# Patient Record
Sex: Female | Born: 1995 | Race: Black or African American | Hispanic: No | Marital: Single | Smoking: Never smoker
Health system: Southern US, Community
[De-identification: ages and names within clinical notes are randomized; demographics above are authoritative.]

---
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
## 2014-05-14 ENCOUNTER — Emergency Department (HOSPITAL_COMMUNITY)
Admission: EM | Admit: 2014-05-14 | Discharge: 2014-05-14 | Disposition: A | Discharge status: Home / Self Care | Payer: BC Managed Care – PPO | Source: Home / Self Care | Attending: Emergency Medicine | Admitting: Emergency Medicine

## 2014-05-14 ENCOUNTER — Encounter (HOSPITAL_COMMUNITY): Payer: Self-pay | Admitting: Emergency Medicine

## 2014-05-14 DIAGNOSIS — N390 Urinary tract infection, site not specified: Secondary | ICD-10-CM

## 2014-05-14 LAB — POCT URINALYSIS DIP (DEVICE)
Bilirubin Urine: NEGATIVE
GLUCOSE, UA: NEGATIVE mg/dL
Ketones, ur: NEGATIVE mg/dL
NITRITE: NEGATIVE
Protein, ur: 100 mg/dL — AB
Specific Gravity, Urine: >=1.03 (ref 1.005–1.030)
UROBILINOGEN UA: 0.2 mg/dL (ref 0.0–1.0)
pH: 6 (ref 5.0–8.0)

## 2014-05-14 LAB — POCT PREGNANCY, URINE: PREG TEST UR: NEGATIVE

## 2014-05-14 MED ORDER — CEFUROXIME AXETIL 250 MG PO TABS
250.0000 mg | ORAL_TABLET | Freq: Two times a day (BID) | ORAL | Status: DC
Start: 2014-05-14 — End: 2016-09-27

## 2014-05-14 NOTE — ED Provider Notes (Signed)
CSN: [id number]     Arrival date & time 05/14/14  1333 History   First MD Initiated Contact with Patient 05/14/14 1416     Chief Complaint  Patient presents with  . Abdominal Pain   (Consider location/radiation/quality/duration/timing/severity/associated sxs/prior Treatment) HPI      18 year old female presents for evaluation of lower abdominal pain, urinary frequency, and urinary urgency. This began last night. She has also noted a small amount of blood in her urine. She has had a UTI in the past that seemed very similar to this. Denies vaginal discharge. No risk for STDs. No nausea, vomiting, fever, chills, flank pain.  History reviewed. No pertinent past medical history. History reviewed. No pertinent past surgical history. No family history on file. History  Substance Use Topics  . Smoking status: Never Smoker   . Smokeless tobacco: Not on file  . Alcohol Use: No   OB History    No data available     Review of Systems  Gastrointestinal: Positive for abdominal pain. Negative for nausea, vomiting and diarrhea.  Genitourinary: Positive for urgency, frequency and hematuria. Negative for dysuria, flank pain and vaginal discharge.  All other systems reviewed and are negative.   Allergies  Review of patient's allergies indicates no known allergies.  Home Medications   Prior to Admission medications   Medication Sig Start Date End Date Taking? Authorizing Provider  cefUROXime (CEFTIN) 250 MG tablet Take 1 tablet (250 mg total) by mouth 2 (two) times daily with a meal. 05/14/14   Liam Graham, PA-C  dicyclomine (BENTYL) 20 MG tablet Take 1 tablet (20 mg total) by mouth 2 (two) times daily. 04/23/13   Kaitlyn Szekalski, PA-C  magnesium citrate SOLN Take 0.5 Bottles by mouth once.    Historical Provider, MD  ondansetron (ZOFRAN ODT) 4 MG disintegrating tablet Take 1 tablet (4 mg total) by mouth every 8 (eight) hours as needed for nausea. 04/23/13   Kaitlyn Szekalski, PA-C   polyethylene glycol powder (GLYCOLAX/MIRALAX) powder Take 17 g by mouth 2 (two) times daily. Until daily soft stools  OTC 04/23/13   Kaitlyn Szekalski, PA-C   BP 111/69 mmHg  Pulse 70  Temp(Src) 98.1 F (36.7 C) (Oral)  Resp 18  SpO2 99%  LMP 05/04/2014 Physical Exam  Constitutional: She is oriented to person, place, and time. Vital signs are normal. She appears well-developed and well-nourished. No distress.  HENT:  Head: Normocephalic and atraumatic.  Pulmonary/Chest: Effort normal. No respiratory distress.  Abdominal: Soft. She exhibits no mass. There is tenderness (minimal suprapubic tenderness). There is no rebound and no guarding.  No CVA tenderness  Neurological: She is alert and oriented to person, place, and time. She has normal strength. Coordination normal.  Skin: Skin is warm and dry. No rash noted. She is not diaphoretic.  Psychiatric: She has a normal mood and affect. Judgment normal.  Nursing note and vitals reviewed.   ED Course  Procedures (including critical care time) Labs Review Labs Reviewed  POCT URINALYSIS DIP (DEVICE) - Abnormal; Notable for the following:    Hgb urine dipstick LARGE (*)    Protein, ur 100 (*)    Leukocytes, UA SMALL (*)    All other components within normal limits  URINE CULTURE  POCT PREGNANCY, URINE    Imaging Review No results found.   MDM   1. UTI (lower urinary tract infection)    Simple UTI. Urine culture sent. Treat with Ceftin. Follow-up when necessary  Meds ordered this encounter  Medications  .  cefUROXime (CEFTIN) 250 MG tablet    Sig: Take 1 tablet (250 mg total) by mouth 2 (two) times daily with a meal.    Dispense:  10 tablet    Refill:  0    Order Specific Question:  Supervising Provider    Answer:  Carmela Hurt     Liam Graham, PA-C 05/14/14 1440

## 2014-05-14 NOTE — ED Notes (Signed)
C/o poss UTI onset this am Sx include abd pain and freq urination Denies fevers, chills, back pain, n/v Alert, no signs of acute distress.

## 2014-05-14 NOTE — Discharge Instructions (Signed)

## 2014-05-15 LAB — URINE CULTURE
Colony Count: NO GROWTH
Culture: NO GROWTH

## 2014-08-09 IMAGING — US US ABDOMEN COMPLETE
1 series · 13 of 25 positions shown · non-contrast
Comparison: No priors.

CLINICAL DATA: Abdominal pain.  Menorrhagia.

COMPLETE ABDOMINAL ULTRASOUND

[Series 1: us abdomen complete · 13 of 85 slices shown]
[im 1/85]
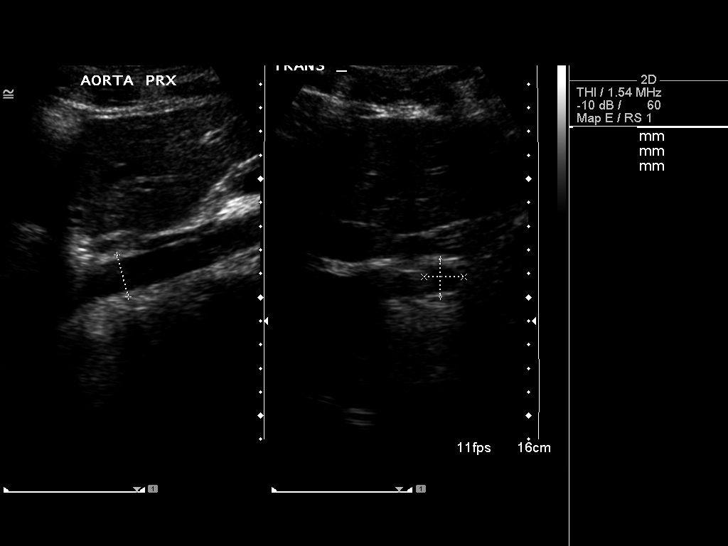
[im 8/85]
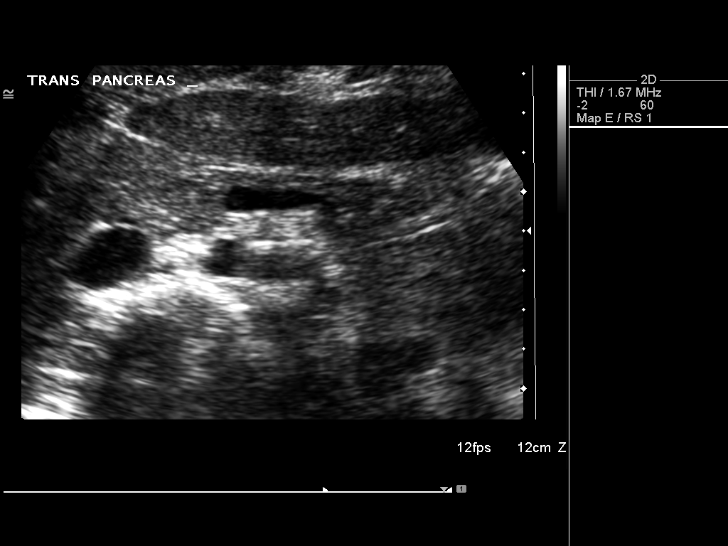
[im 15/85]
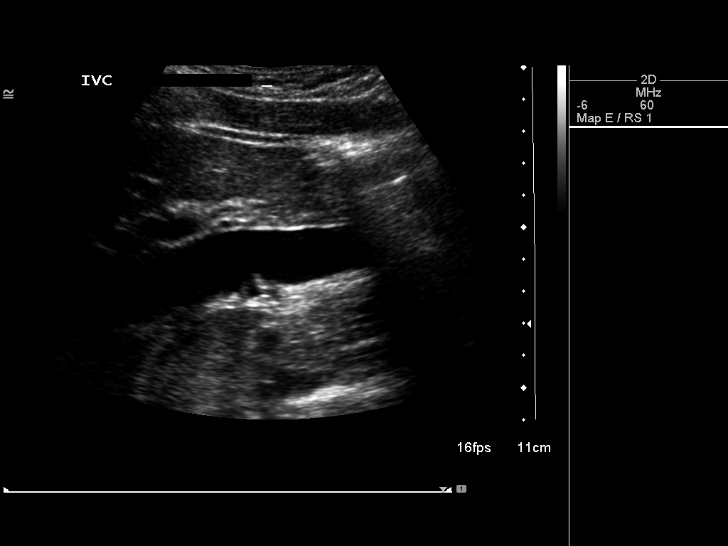
[im 22/85]
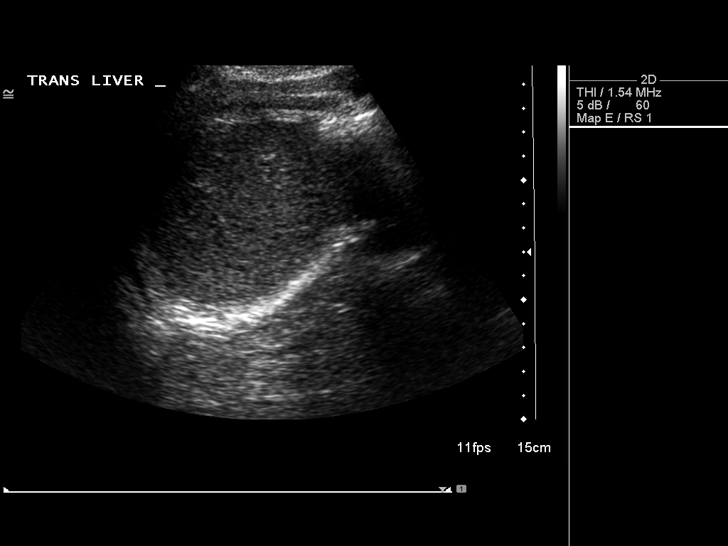
[im 29/85]
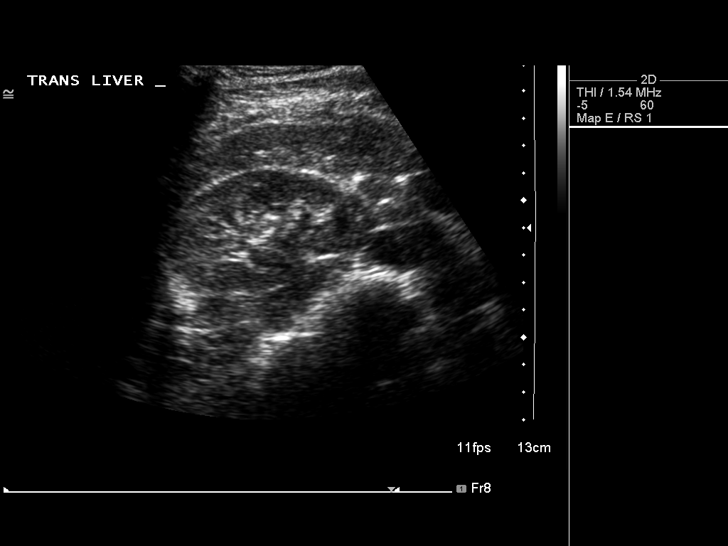
[im 36/85]
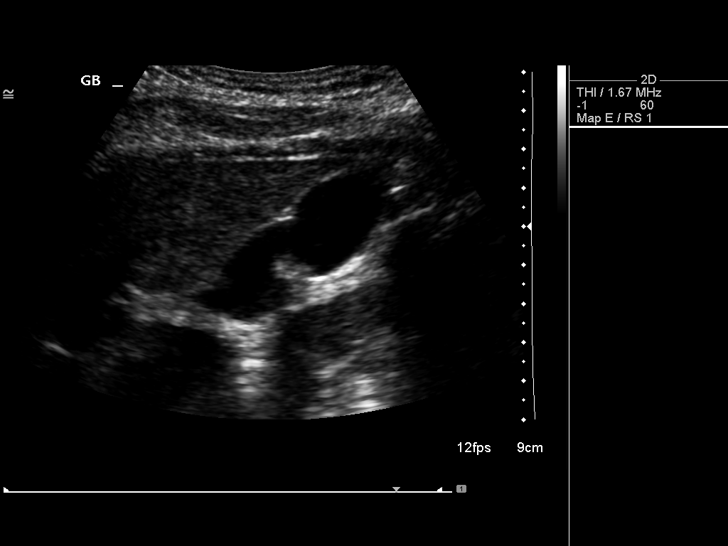
[im 43/85]
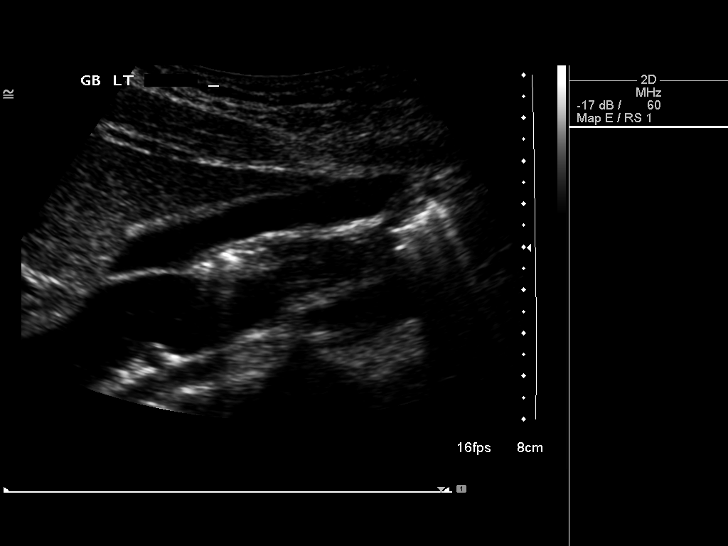
[im 50/85]
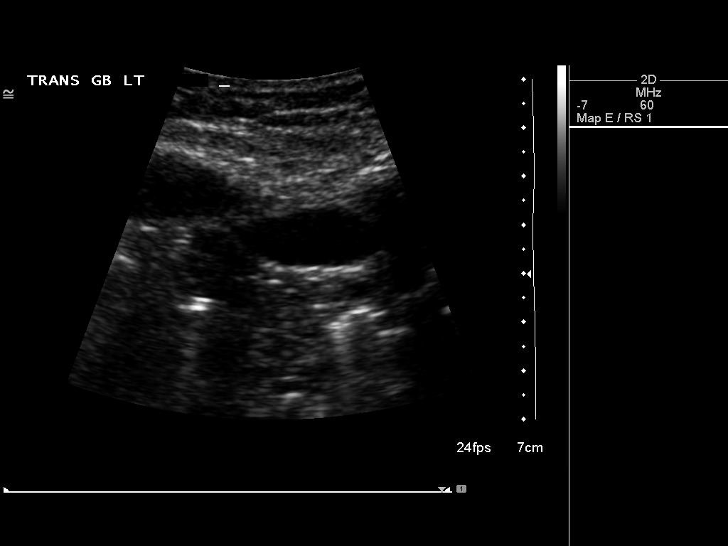
[im 57/85]
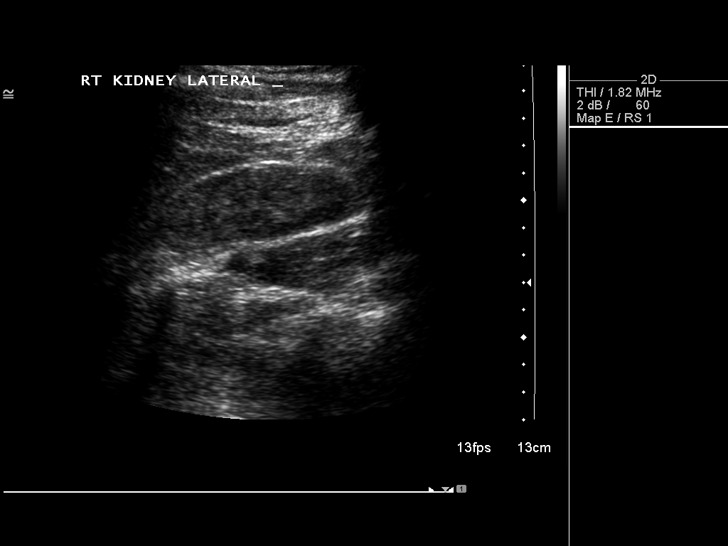
[im 64/85]
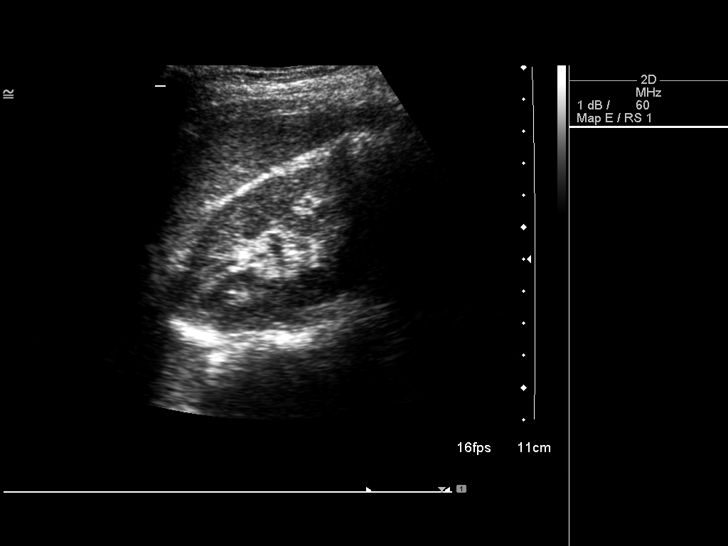
[im 71/85]
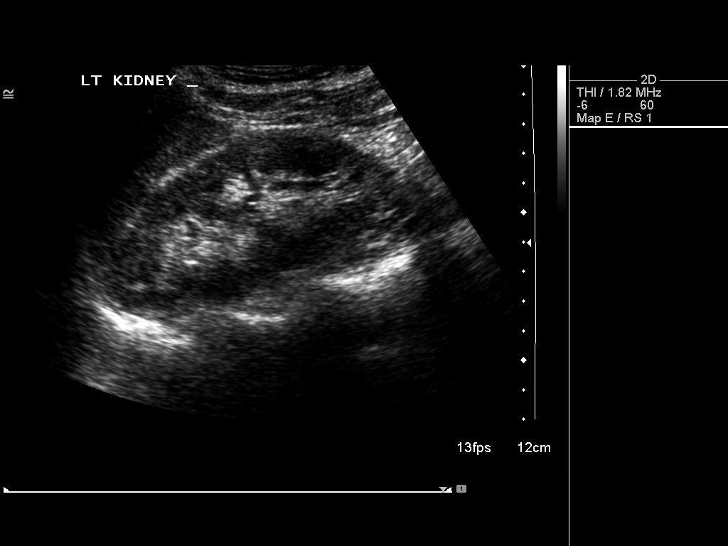
[im 78/85]
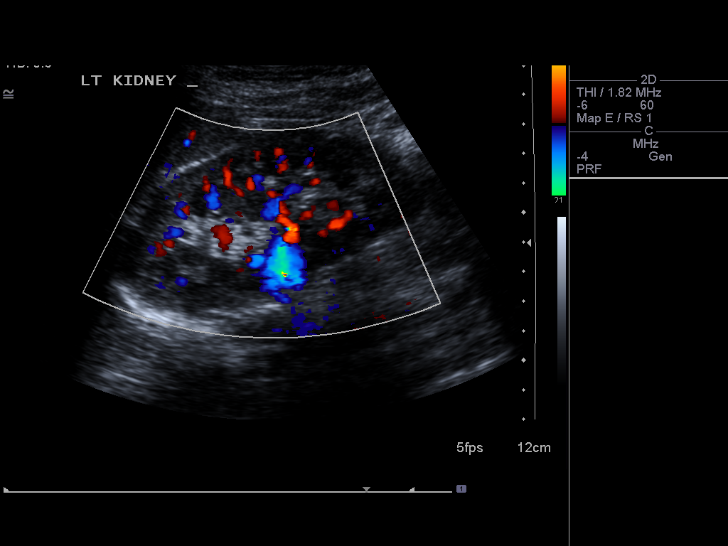
[im 85/85]
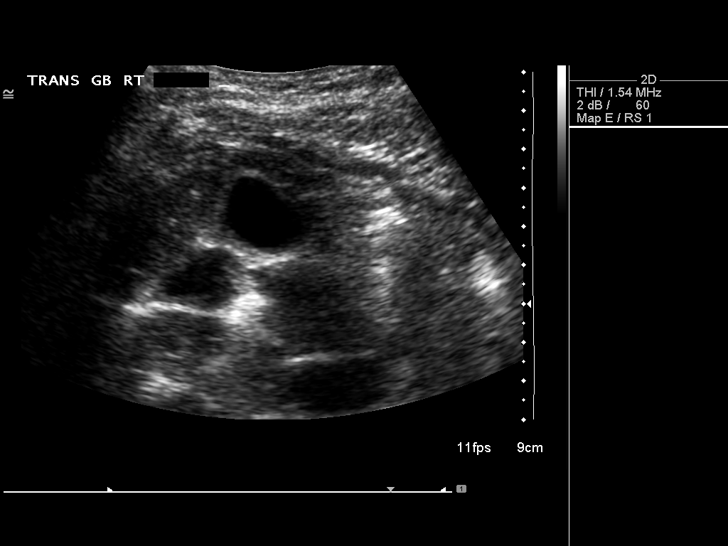

[13 of 25 positions shown; findings below may reference images not displayed]

FINDINGS: Gallbladder:  No shadowing gallstones or echogenic sludge.  No
gallbladder wall thickening or pericholecystic fluid.  Negative
sonographic Murphy's sign according to the ultrasound technologist.

Common bile duct:  Measures 2.1 mm in the porta hepatis.

Liver:  Normal size and echotexture without focal parenchymal
abnormality.  Patent portal vein with hepatopetal flow.

IVC:  Patent throughout its visualized course in the abdomen.

Pancreas:  Although the pancreas is difficult to visualize in its
entirety, no focal pancreatic abnormality is identified.

Spleen:  Normal size and echotexture without focal parenchymal
abnormality.7.5 cm in length.

Right Kidney:  No hydronephrosis.  Well-preserved cortex.  Normal
size and parenchymal echotexture without focal abnormalities.
cm in length.

Left Kidney:  No hydronephrosis.  Well-preserved cortex.  Normal
size and parenchymal echotexture without focal abnormalities.
cm in length.

Abdominal aorta:  Measures up to measures up to 1.8 cm in diameter
proximally, and tapers appropriately distally.
IMPRESSION: 1.  Unremarkable abdominal ultrasound.  No findings to account for
the patient's symptoms.

## 2016-09-26 ENCOUNTER — Ambulatory Visit: Payer: Self-pay | Admitting: Nurse Practitioner

## 2016-09-27 ENCOUNTER — Encounter (HOSPITAL_COMMUNITY): Payer: Self-pay

## 2016-09-27 ENCOUNTER — Emergency Department (HOSPITAL_COMMUNITY)
Admission: EM | Admit: 2016-09-27 | Discharge: 2016-09-27 | Disposition: A | Discharge status: Home / Self Care | Payer: BLUE CROSS/BLUE SHIELD | Attending: Emergency Medicine | Admitting: Emergency Medicine

## 2016-09-27 DIAGNOSIS — R197 Diarrhea, unspecified: Secondary | ICD-10-CM | POA: Insufficient documentation

## 2016-09-27 DIAGNOSIS — R112 Nausea with vomiting, unspecified: Secondary | ICD-10-CM

## 2016-09-27 DIAGNOSIS — M549 Dorsalgia, unspecified: Secondary | ICD-10-CM

## 2016-09-27 DIAGNOSIS — M545 Low back pain: Secondary | ICD-10-CM | POA: Insufficient documentation

## 2016-09-27 LAB — COMPREHENSIVE METABOLIC PANEL
ALK PHOS: 54 U/L (ref 38–126)
ALT: 28 U/L (ref 14–54)
ANION GAP: 10 (ref 5–15)
AST: 29 U/L (ref 15–41)
Albumin: 4.9 g/dL (ref 3.5–5.0)
BUN: 14 mg/dL (ref 6–20)
CO2: 22 mmol/L (ref 22–32)
Calcium: 9.8 mg/dL (ref 8.9–10.3)
Chloride: 105 mmol/L (ref 101–111)
Creatinine, Ser: 0.9 mg/dL (ref 0.44–1.00)
GFR calc non Af Amer: >60 mL/min (ref >60)
Glucose, Bld: 97 mg/dL (ref 65–99)
POTASSIUM: 3.5 mmol/L (ref 3.5–5.1)
SODIUM: 137 mmol/L (ref 135–145)
Total Bilirubin: 1.5 mg/dL — ABNORMAL HIGH (ref 0.3–1.2)
Total Protein: 8.5 g/dL — ABNORMAL HIGH (ref 6.5–8.1)

## 2016-09-27 LAB — CBC
HCT: 41.2 % (ref 36.0–46.0)
HEMOGLOBIN: 14.5 g/dL (ref 12.0–15.0)
MCH: 30.7 pg (ref 26.0–34.0)
MCHC: 35.2 g/dL (ref 30.0–36.0)
MCV: 87.1 fL (ref 78.0–100.0)
PLATELETS: 262 10*3/uL (ref 150–400)
RBC: 4.73 MIL/uL (ref 3.87–5.11)
RDW: 14 % (ref 11.5–15.5)
WBC: 8.3 10*3/uL (ref 4.0–10.5)

## 2016-09-27 LAB — URINALYSIS, ROUTINE W REFLEX MICROSCOPIC
BILIRUBIN URINE: NEGATIVE
Bacteria, UA: NONE SEEN
GLUCOSE, UA: NEGATIVE mg/dL
HGB URINE DIPSTICK: NEGATIVE
Ketones, ur: 80 mg/dL — AB
NITRITE: NEGATIVE
PH: 8 (ref 5.0–8.0)
Protein, ur: NEGATIVE mg/dL
SPECIFIC GRAVITY, URINE: 1.026 (ref 1.005–1.030)

## 2016-09-27 LAB — LIPASE, BLOOD: LIPASE: 18 U/L (ref 11–51)

## 2016-09-27 LAB — PREGNANCY, URINE: Preg Test, Ur: NEGATIVE

## 2016-09-27 MED ORDER — KETOROLAC TROMETHAMINE 60 MG/2ML IM SOLN
60.0000 mg | Freq: Once | INTRAMUSCULAR | Status: AC
  Administered 2016-09-27: 60 mg via INTRAMUSCULAR
  Filled 2016-09-27: qty 2

## 2016-09-27 MED ORDER — ONDANSETRON 4 MG PO TBDP: 4.0000 mg | ORAL_TABLET | Freq: Three times a day (TID) | ORAL | 0 refills | Status: DC | PRN

## 2016-09-27 MED ORDER — IBUPROFEN 800 MG PO TABS
800.0000 mg | ORAL_TABLET | Freq: Once | ORAL | Status: AC
  Administered 2016-09-27: 800 mg via ORAL
  Filled 2016-09-27: qty 1

## 2016-09-27 MED ORDER — METHOCARBAMOL 500 MG PO TABS: 500.0000 mg | ORAL_TABLET | Freq: Two times a day (BID) | ORAL | 0 refills | Status: DC

## 2016-09-27 MED ORDER — METHOCARBAMOL 500 MG PO TABS
500.0000 mg | ORAL_TABLET | Freq: Once | ORAL | Status: AC
  Administered 2016-09-27: 500 mg via ORAL
  Filled 2016-09-27: qty 1

## 2016-09-27 NOTE — ED Notes (Signed)
PT GIVEN 240ML OF WATER FOR PO CHALLENGE.

## 2016-09-27 NOTE — ED Notes (Signed)
Patient refused zofran when offered. Patient stated, "I really don't have nausea right now.

## 2016-09-27 NOTE — ED Triage Notes (Signed)
Patient c/o n/V and diarrhea x 1 since this AM.

## 2016-09-27 NOTE — ED Provider Notes (Signed)
Recheck after medications from back pain. Motrin, Robaxin, Toradol.  Recheck finds the patient comfortable appearing, talking with mom. She is feeling better and is stable for discharge.    Charlann Lange, PA-C 09/28/16 [place], MD 10/02/16 [phone]

## 2016-09-27 NOTE — Discharge Instructions (Signed)
Take the prescribed medication as directed.  Can continue your 800mg  motrin at home with these medications.  Can also continue warm compresses to the back with heating pad. Follow-up with your primary care doctor. Return to the ED for new or worsening symptoms.

## 2016-09-27 NOTE — ED Provider Notes (Signed)
[facility] DEPT Provider Note   CSN: [id number] Arrival date & time: 09/27/16  1529     History   Chief Complaint Chief Complaint  Patient presents with  . Emesis  . Back Pain    HPI Ana Bean is a 21 y.o. female.  The history is provided by the patient and medical records.  Emesis   Associated symptoms include diarrhea.  Back Pain      21 year old female here with nausea, vomiting, and diarrhea which began around 7 AM upon waking this morning. She reports multiple episodes this morning, but improved by 2 PM this afternoon. States those symptoms seem to have improved, now she just has ongoing back pain. She thinks this is from where she was leaned over the toilet all morning. She has not had any falls or trauma. Reports pain is dull and aching with some "spasms". Pain has improved with application of heat pack here in the ED. She denies any fever or chills. No urinary symptoms. She does have history of UTIs in the past. Last menstrual period was 2 weeks ago and was normal.  Has not tried any medications for her symptoms today.  History reviewed. No pertinent past medical history.  There are no active problems to display for this patient.   History reviewed. No pertinent surgical history.  OB History    No data available       Home Medications    Prior to Admission medications   Not on File    Family History No family history on file.  Social History Social History  Substance Use Topics  . Smoking status: Never Smoker  . Smokeless tobacco: Never Used  . Alcohol use No     Allergies   Patient has no known allergies.   Review of Systems Review of Systems  Gastrointestinal: Positive for diarrhea, nausea and vomiting.  Musculoskeletal: Positive for back pain.  All other systems reviewed and are negative.    Physical Exam Updated Vital Signs BP 101/68 (BP Location: Right Arm)   Pulse 86   Temp 98.1 F (36.7 C) (Oral)   Resp 12   Ht 5\' 3"   (1.6 m)   Wt 52.2 kg   LMP 09/08/2016 (Approximate)   SpO2 100%   BMI 20.37 kg/m   Physical Exam  Constitutional: She is oriented to person, place, and time. She appears well-developed and well-nourished.  HENT:  Head: Normocephalic and atraumatic.  Mouth/Throat: Oropharynx is clear and moist.  Eyes: Conjunctivae and EOM are normal. Pupils are equal, round, and reactive to light.  Neck: Normal range of motion.  Cardiovascular: Normal rate, regular rhythm and normal heart sounds.   Pulmonary/Chest: Effort normal and breath sounds normal. No respiratory distress. She has no wheezes.  Abdominal: Soft. Bowel sounds are normal. There is no tenderness. There is no rebound.  No CVA tenderness  Musculoskeletal: Normal range of motion.  Muscular tenderness of the lumbar region which is mild, there is no midline shift or deformity, full range of motion maintained, normal strength/sensation of both legs, normal gait  Neurological: She is alert and oriented to person, place, and time.  Skin: Skin is warm and dry.  Psychiatric: She has a normal mood and affect.  Nursing note and vitals reviewed.    ED Treatments / Results  Labs (all labs ordered are listed, but only abnormal results are displayed) Labs Reviewed  COMPREHENSIVE METABOLIC PANEL - Abnormal; Notable for the following:       Result Value  Total Protein 8.5 (*)    Total Bilirubin 1.5 (*)    All other components within normal limits  URINALYSIS, ROUTINE W REFLEX MICROSCOPIC - Abnormal; Notable for the following:    APPearance HAZY (*)    Ketones, ur 80 (*)    Leukocytes, UA TRACE (*)    Squamous Epithelial / LPF 0-5 (*)    All other components within normal limits  LIPASE, BLOOD  CBC  PREGNANCY, URINE    EKG  EKG Interpretation None       Radiology No results found.  Procedures Procedures (including critical care time)  Medications Ordered in ED Medications - No data to display   Initial Impression /  Assessment and Plan / ED Course  I have reviewed the triage vital signs and the nursing notes.  Pertinent labs & imaging results that were available during my care of the patient were reviewed by me and considered in my medical decision making (see chart for details).  21 year old female here with nausea, vomiting, and diarrhea which begins waking. He now reports some back pain. She is afebrile and nontoxic. Abdomen is soft and benign.  She has some muscular tenderness of her lumbar paraspinal regions bilaterally without noted midline step-off or deformity.  No neurologic deficits, no red flag symptoms.  May have pain from being bent over vomiting all morning.  Screening lab work overall reassuring.  UA here without signs of infection or blood to suggest acute renal pathology.  Suspect muscular etiology of her back pain, likely viral etiology of her N/V/D.  Will plan for symptomatic care here, d/c home with same.  Currently tolerating PO well without issue.  7:52 PM Care signed out to oncoming provider.  Anticipate discharge once feeling better.  Final Clinical Impressions(s) / ED Diagnoses   Final diagnoses:  Nausea vomiting and diarrhea  Back pain, unspecified back location, unspecified back pain laterality, unspecified chronicity    New Prescriptions New Prescriptions   METHOCARBAMOL (ROBAXIN) 500 MG TABLET    Take 1 tablet (500 mg total) by mouth 2 (two) times daily.   ONDANSETRON (ZOFRAN ODT) 4 MG DISINTEGRATING TABLET    Take 1 tablet (4 mg total) by mouth every 8 (eight) hours as needed for nausea.     Larene Pickett, PA-C 09/27/16 1953    Duffy Bruce, MD 09/29/16 [phone]

## 2016-10-07 ENCOUNTER — Encounter: Payer: Self-pay | Admitting: Nurse Practitioner

## 2016-10-07 ENCOUNTER — Ambulatory Visit (INDEPENDENT_AMBULATORY_CARE_PROVIDER_SITE_OTHER)
Admission: RE | Admit: 2016-10-07 | Discharge: 2016-10-07 | Disposition: A | Discharge status: Home / Self Care | Payer: BLUE CROSS/BLUE SHIELD | Source: Ambulatory Visit | Attending: Nurse Practitioner | Admitting: Nurse Practitioner

## 2016-10-07 ENCOUNTER — Ambulatory Visit: Payer: Self-pay | Admitting: Nurse Practitioner

## 2016-10-07 ENCOUNTER — Ambulatory Visit (INDEPENDENT_AMBULATORY_CARE_PROVIDER_SITE_OTHER): Payer: BLUE CROSS/BLUE SHIELD | Admitting: Nurse Practitioner

## 2016-10-07 ENCOUNTER — Other Ambulatory Visit (INDEPENDENT_AMBULATORY_CARE_PROVIDER_SITE_OTHER): Payer: BLUE CROSS/BLUE SHIELD

## 2016-10-07 VITALS — BP 106/72 | HR 58 | Temp 97.6°F | Ht 63.0 in | Wt 110.0 lb

## 2016-10-07 DIAGNOSIS — R634 Abnormal weight loss: Secondary | ICD-10-CM

## 2016-10-07 DIAGNOSIS — R61 Generalized hyperhidrosis: Secondary | ICD-10-CM

## 2016-10-07 DIAGNOSIS — F419 Anxiety disorder, unspecified: Secondary | ICD-10-CM | POA: Insufficient documentation

## 2016-10-07 LAB — T4, FREE: FREE T4: 1.03 ng/dL (ref 0.60–1.60)

## 2016-10-07 LAB — TSH: TSH: 1.57 u[IU]/mL (ref 0.35–4.50)

## 2016-10-07 LAB — HEMOGLOBIN A1C: HEMOGLOBIN A1C: 5.5 % (ref 4.6–6.5)

## 2016-10-07 NOTE — Patient Instructions (Addendum)
Sign medical release to get records from GYN.  Urine pregnancy, CBC, CMP, lipase, and urinalysis done 1week ago.

## 2016-10-07 NOTE — Progress Notes (Signed)
Pre visit review using our clinic review tool, if applicable. No additional management support is needed unless otherwise documented below in the visit note. 

## 2016-10-07 NOTE — Progress Notes (Signed)
Subjective:  Patient ID: Ana Bean, female    DOB: 09/04/1995  Age: 21 y.o. MRN: [id number]  CC: Establish Care (est care/breast exam ([facility])--lossing weight--20 lb within 1 month. tdap?)   HPI   GYN: Dr.Haygood, up to date with PAP  Weight loss: [place] more than 20Ibs in last 25months Normal appetite. Also develops night sweats in last few weeks. Hx of anxiety, no medication. No palpitation. Denies any stress or anxiety or depression at this time. Denies any recent travel out of country. No nausea, no ABD pain, no diarrhea. No Gu symptoms Sexually active, use condom, no birth control. LMP 10/06/2016.  Outpatient Medications Prior to Visit  Medication Sig Dispense Refill  . methocarbamol (ROBAXIN) 500 MG tablet Take 1 tablet (500 mg total) by mouth 2 (two) times daily. (Patient not taking: Reported on 10/07/2016) 20 tablet 0  . ondansetron (ZOFRAN ODT) 4 MG disintegrating tablet Take 1 tablet (4 mg total) by mouth every 8 (eight) hours as needed for nausea. (Patient not taking: Reported on 10/07/2016) 10 tablet 0   No facility-administered medications prior to visit.     ROS See HPI  Objective:  BP 106/72   Pulse (!) 58   Temp 97.6 F (36.4 C)   Ht 5\' 3"  (1.6 m)   Wt 110 lb (49.9 kg)   LMP 10/06/2016 (Exact Date)   SpO2 98%   BMI 19.49 kg/m   BP Readings from Last 3 Encounters:  10/07/16 106/72  09/27/16 (!) 93/58  05/14/14 111/69    Wt Readings from Last 3 Encounters:  10/07/16 110 lb (49.9 kg)  09/27/16 115 lb (52.2 kg)  04/23/13 117 lb (53.1 kg) (34 %, Z= -0.41)*   * Growth percentiles are based on CDC 2-20 Years data.    Physical Exam  Constitutional: She is oriented to person, place, and time.  Neck: Normal range of motion. Neck supple.  Cardiovascular: Normal rate, regular rhythm and normal heart sounds.   Pulmonary/Chest: Effort normal and breath sounds normal.  Abdominal: Soft. Bowel sounds are normal.  Lymphadenopathy:    She has no cervical  adenopathy.  Neurological: She is alert and oriented to person, place, and time.  Skin: Skin is warm and dry.  Vitals reviewed.   Lab Results  Component Value Date   WBC 8.3 09/27/2016   HGB 14.5 09/27/2016   HCT 41.2 09/27/2016   PLT 262 09/27/2016   GLUCOSE 97 09/27/2016   ALT 28 09/27/2016   AST 29 09/27/2016   NA 137 09/27/2016   K 3.5 09/27/2016   CL 105 09/27/2016   CREATININE 0.90 09/27/2016   BUN 14 09/27/2016   CO2 22 09/27/2016    No results found.  Assessment & Plan:   Ana Bean was seen today for establish care.  Diagnoses and all orders for this visit:  Weight loss, unintentional -     TSH; Future -     T4, free; Future -     DG Chest 2 View; Future -     Hemoglobin A1c; Future  Night sweats -     TSH; Future -     T4, free; Future -     DG Chest 2 View; Future -     Hemoglobin A1c; Future   I have discontinued Ana Bean's ondansetron and methocarbamol.  No orders of the defined types were placed in this encounter.   Follow-up: Return in about 1 month (around 11/06/2016) for CPE and weight loss (fasting).  Wilfred Lacy,  NP

## 2016-10-07 NOTE — Progress Notes (Signed)
Normal results

## 2016-10-17 ENCOUNTER — Telehealth: Payer: Self-pay | Admitting: Nurse Practitioner

## 2016-10-17 NOTE — Telephone Encounter (Signed)
ROI fax to [facility]

## 2016-11-07 ENCOUNTER — Encounter: Payer: Self-pay | Admitting: Nurse Practitioner

## 2016-11-07 ENCOUNTER — Ambulatory Visit (INDEPENDENT_AMBULATORY_CARE_PROVIDER_SITE_OTHER): Payer: BLUE CROSS/BLUE SHIELD | Admitting: Nurse Practitioner

## 2016-11-07 ENCOUNTER — Other Ambulatory Visit (INDEPENDENT_AMBULATORY_CARE_PROVIDER_SITE_OTHER): Payer: BLUE CROSS/BLUE SHIELD

## 2016-11-07 ENCOUNTER — Other Ambulatory Visit (HOSPITAL_COMMUNITY)
Admission: RE | Admit: 2016-11-07 | Discharge: 2016-11-07 | Disposition: A | Discharge status: Home / Self Care | Payer: BLUE CROSS/BLUE SHIELD | Source: Ambulatory Visit | Attending: Nurse Practitioner | Admitting: Nurse Practitioner

## 2016-11-07 VITALS — BP 110/80 | HR 66 | Temp 97.6°F | Ht 63.0 in | Wt 113.0 lb

## 2016-11-07 DIAGNOSIS — Z0000 Encounter for general adult medical examination without abnormal findings: Secondary | ICD-10-CM

## 2016-11-07 DIAGNOSIS — Z13220 Encounter for screening for lipoid disorders: Secondary | ICD-10-CM | POA: Diagnosis not present

## 2016-11-07 DIAGNOSIS — A5901 Trichomonal vulvovaginitis: Secondary | ICD-10-CM | POA: Insufficient documentation

## 2016-11-07 DIAGNOSIS — Z136 Encounter for screening for cardiovascular disorders: Secondary | ICD-10-CM

## 2016-11-07 DIAGNOSIS — Z23 Encounter for immunization: Secondary | ICD-10-CM | POA: Diagnosis not present

## 2016-11-07 DIAGNOSIS — N6315 Unspecified lump in the right breast, overlapping quadrants: Secondary | ICD-10-CM | POA: Insufficient documentation

## 2016-11-07 DIAGNOSIS — Z124 Encounter for screening for malignant neoplasm of cervix: Secondary | ICD-10-CM | POA: Insufficient documentation

## 2016-11-07 DIAGNOSIS — Z0001 Encounter for general adult medical examination with abnormal findings: Secondary | ICD-10-CM

## 2016-11-07 DIAGNOSIS — Z114 Encounter for screening for human immunodeficiency virus [HIV]: Secondary | ICD-10-CM

## 2016-11-07 DIAGNOSIS — N6310 Unspecified lump in the right breast, unspecified quadrant: Secondary | ICD-10-CM

## 2016-11-07 LAB — LIPID PANEL
CHOL/HDL RATIO: 2
Cholesterol: 173 mg/dL (ref 0–200)
HDL: 82.9 mg/dL (ref >39.00)
LDL CALC: 79 mg/dL (ref 0–99)
NONHDL: 90.51
TRIGLYCERIDES: 58 mg/dL (ref 0.0–149.0)
VLDL: 11.6 mg/dL (ref 0.0–40.0)

## 2016-11-07 LAB — HIV ANTIBODY (ROUTINE TESTING W REFLEX): HIV 1&2 Ab, 4th Generation: NONREACTIVE

## 2016-11-07 NOTE — Patient Instructions (Signed)
Preventive Care 18-39 Years, Female Preventive care refers to lifestyle choices and visits with your health care provider that can promote health and wellness. What does preventive care include?  A yearly physical exam. This is also called an annual well check.  Dental exams once or twice a year.  Routine eye exams. Ask your health care provider how often you should have your eyes checked.  Personal lifestyle choices, including:  Daily care of your teeth and gums.  Regular physical activity.  Eating a healthy diet.  Avoiding tobacco and drug use.  Limiting alcohol use.  Practicing safe sex.  Taking vitamin and mineral supplements as recommended by your health care provider. What happens during an annual well check? The services and screenings done by your health care provider during your annual well check will depend on your age, overall health, lifestyle risk factors, and family history of disease. Counseling  Your health care provider may ask you questions about your:  Alcohol use.  Tobacco use.  Drug use.  Emotional well-being.  Home and relationship well-being.  Sexual activity.  Eating habits.  Work and work environment.  Method of birth control.  Menstrual cycle.  Pregnancy history. Screening  You may have the following tests or measurements:  Height, weight, and BMI.  Diabetes screening. This is done by checking your blood sugar (glucose) after you have not eaten for a while (fasting).  Blood pressure.  Lipid and cholesterol levels. These may be checked every 5 years starting at age 20.  Skin check.  Hepatitis C blood test.  Hepatitis B blood test.  Sexually transmitted disease (STD) testing.  BRCA-related cancer screening. This may be done if you have a family history of breast, ovarian, tubal, or peritoneal cancers.  Pelvic exam and Pap test. This may be done every 3 years starting at age 21. Starting at age 30, this may be done every 5  years if you have a Pap test in combination with an HPV test. Discuss your test results, treatment options, and if necessary, the need for more tests with your health care provider. Vaccines  Your health care provider may recommend certain vaccines, such as:  Influenza vaccine. This is recommended every year.  Tetanus, diphtheria, and acellular pertussis (Tdap, Td) vaccine. You may need a Td booster every 10 years.  Varicella vaccine. You may need this if you have not been vaccinated.  HPV vaccine. If you are 26 or younger, you may need three doses over 6 months.  Measles, mumps, and rubella (MMR) vaccine. You may need at least one dose of MMR. You may also need a second dose.  Pneumococcal 13-valent conjugate (PCV13) vaccine. You may need this if you have certain conditions and were not previously vaccinated.  Pneumococcal polysaccharide (PPSV23) vaccine. You may need one or two doses if you smoke cigarettes or if you have certain conditions.  Meningococcal vaccine. One dose is recommended if you are age 19-21 years and a first-year college student living in a residence hall, or if you have one of several medical conditions. You may also need additional booster doses.  Hepatitis A vaccine. You may need this if you have certain conditions or if you travel or work in places where you may be exposed to hepatitis A.  Hepatitis B vaccine. You may need this if you have certain conditions or if you travel or work in places where you may be exposed to hepatitis B.  Haemophilus influenzae type b (Hib) vaccine. You may need this   if you have certain risk factors. Talk to your health care provider about which screenings and vaccines you need and how often you need them. This information is not intended to replace advice given to you by your health care provider. Make sure you discuss any questions you have with your health care provider. Document Released: 08/16/2001 Document Revised: 03/09/2016  Document Reviewed: 04/21/2015 Elsevier Interactive Patient Education  2017 [employer].

## 2016-11-07 NOTE — Progress Notes (Signed)
Pre visit review using our clinic review tool, if applicable. No additional management support is needed unless otherwise documented below in the visit note. 

## 2016-11-07 NOTE — Progress Notes (Addendum)
Subjective:    Patient ID: Ana Bean, female    DOB: 07-11-1995, 21 y.o.   MRN: [id number]  Patient presents today for complete physical HPI   Weight loss: No weight loss noted today. Last weight 110Lbs. Today's weight 113Lbs. No change in appetite. Reports that night sweats have resolved.  Right Breast Mass: Present for over 2years. Right breast mass (benign per patient: last breast US done 2years ago by [facility], instructed to repeat every 96yrs).  Immunizations: (TDAP, Hep C screen, Pneumovax, Influenza, zoster)  Health Maintenance  Topic Date Due  . Pap Smear  01/27/2016  . Flu Shot  02/01/2017  . Tetanus Vaccine  11/08/2026  . HIV Screening  Completed   Diet:regular Weight:  Wt Readings from Last 3 Encounters:  11/07/16 113 lb (51.3 kg)  10/07/16 110 lb (49.9 kg)  09/27/16 115 lb (52.2 kg)   Exercise:none Fall Risk: Fall Risk  10/07/2016  Falls in the past year? No   Home Safety:home with room mates, family is local. Depression/Suicide: Depression screen [facility] 2/9 10/07/2016  Decreased Interest 0  Down, Depressed, Hopeless 0  PHQ - 2 Score 0   Pap Smear (every 61yrs for >21-29 without HPV, every 41yrs for >30-36yrs with HPV):needed Vision:not needed Dental:up to date Advanced Directive: Advanced Directives 09/27/2016  Does Patient Have a Medical Advance Directive? No  Would patient like information on creating a medical advance directive? No - Patient declined   Sexual History (birth control, marital status, STD):sexually active, use of condoms all the time.  Medications and allergies reviewed with patient and updated if appropriate.  Patient Active Problem List   Diagnosis Date Noted  . Breast lump on right side at 9 o'clock position 11/07/2016  . Breast lump on right side at 6 o'clock position 11/07/2016  . Anxiety 10/07/2016  . Weight loss 10/07/2016  . Weight loss, unintentional 10/07/2016  . Night sweats 10/07/2016    No current  outpatient prescriptions on file prior to visit.   No current facility-administered medications on file prior to visit.     No past medical history on file.  No past surgical history on file.  Social History   Social History  . Marital status: Single    Spouse name: N/A  . Number of children: N/A  . Years of education: N/A   Social History Main Topics  . Smoking status: Never Smoker  . Smokeless tobacco: Never Used  . Alcohol use No  . Drug use: No  . Sexual activity: Not Asked   Other Topics Concern  . None   Social History Narrative  . None    Family History  Problem Relation Age of Onset  . Cancer Maternal Grandmother     breast cancer        Review of Systems  Constitutional: Negative for fever, malaise/fatigue and weight loss.  HENT: Negative for congestion and sore throat.   Eyes:       Negative for visual changes  Respiratory: Negative for cough and shortness of breath.   Cardiovascular: Negative for chest pain, palpitations and leg swelling.  Gastrointestinal: Negative for blood in stool, constipation, diarrhea and heartburn.  Genitourinary: Negative for dysuria, frequency and urgency.  Musculoskeletal: Negative for falls, joint pain and myalgias.  Skin: Negative for rash.  Neurological: Negative for dizziness, sensory change and headaches.  Endo/Heme/Allergies: Does not bruise/bleed easily.  Psychiatric/Behavioral: Negative for depression, substance abuse and suicidal ideas. The patient is not nervous/anxious.  Objective:   Vitals:   11/07/16 0811  BP: 110/80  Pulse: 66  Temp: 97.6 F (36.4 C)    Body mass index is 20.02 kg/m.   Physical Examination:  Physical Exam  Constitutional: She is oriented to person, place, and time and well-developed, well-nourished, and in no distress. No distress.  HENT:  Right Ear: External ear normal.  Left Ear: External ear normal.  Nose: Nose normal.  Mouth/Throat: No oropharyngeal exudate.    Eyes: Conjunctivae and EOM are normal. Pupils are equal, round, and reactive to light. No scleral icterus.  Neck: Normal range of motion. Neck supple. No thyromegaly present.  Cardiovascular: Normal rate, regular rhythm, normal heart sounds and intact distal pulses.   Pulmonary/Chest: Effort normal and breath sounds normal. Right breast exhibits inverted nipple and mass. Right breast exhibits no nipple discharge, no skin change and no tenderness. Left breast exhibits inverted nipple. Left breast exhibits no mass, no nipple discharge, no skin change and no tenderness. Breasts are symmetrical.    Right breast mass (benign per patient: last breast US done 2years ago by [facility], instructed to repeat every 42yrs)  Abdominal: Soft. Bowel sounds are normal. She exhibits no distension. There is no tenderness.  Genitourinary: Rectum normal, vagina normal, uterus normal, cervix normal, right adnexa normal, left adnexa normal and vulva normal. Rectal exam shows no external hemorrhoid. Cervix exhibits no motion tenderness. No vaginal discharge found.  Musculoskeletal: Normal range of motion. She exhibits no edema or tenderness.  Lymphadenopathy:    She has no cervical adenopathy.  Neurological: She is alert and oriented to person, place, and time. Gait normal.  Skin: Skin is warm and dry.  Psychiatric: Affect and judgment normal.  Vitals reviewed.   ASSESSMENT and PLAN:  Ana Bean was seen today for annual exam.  Diagnoses and all orders for this visit:  Encounter for preventative adult health care exam with abnormal findings -     HIV antibody; Future -     Cytology - PAP -     Lipid panel; Future  Encounter for screening for human immunodeficiency virus (HIV) -     HIV antibody; Future  Cervical cancer screening -     Cytology - PAP  Encounter for lipid screening for cardiovascular disease -     Lipid panel; Future  Need for diphtheria-tetanus-pertussis (Tdap) vaccine -     Tdap  vaccine greater than or equal to 7yo IM  Trichomoniasis of vagina -     metroNIDAZOLE (FLAGYL) 500 MG tablet; Take 1 tablet (500 mg total) by mouth once.    No problem-specific Assessment & Plan notes found for this encounter.      Follow up: Return in about 1 year (around 11/07/2017) for CPE.  Wilfred Lacy, NP

## 2016-11-08 ENCOUNTER — Telehealth: Payer: Self-pay | Admitting: Nurse Practitioner

## 2016-11-08 LAB — CYTOLOGY - PAP
Adequacy: ABSENT
Chlamydia: NEGATIVE
Diagnosis: NEGATIVE
NEISSERIA GONORRHEA: NEGATIVE

## 2016-11-08 MED ORDER — METRONIDAZOLE 500 MG PO TABS: 500.0000 mg | ORAL_TABLET | Freq: Once | ORAL | 0 refills | Status: AC

## 2016-11-08 NOTE — Addendum Note (Signed)
Addended by: Wilfred Lacy L on: 11/08/2016 01:04 PM   Modules accepted: Orders

## 2016-11-08 NOTE — Telephone Encounter (Signed)
Pt would like call back regarding metroNIDAZOLE (FLAGYL) 500 MG tablet.  She was told by pharmacists to take all 4 pills at once. Would like clarification on how to take the pills.

## 2016-11-08 NOTE — Telephone Encounter (Signed)
Spoke with pt, to take all 4 pill at once with food. Advise pt to make an appt to come back in 2 wks to recheck. Pt verbalized understand.

## 2017-03-07 ENCOUNTER — Other Ambulatory Visit: Payer: Self-pay

## 2017-06-20 ENCOUNTER — Encounter: Payer: Self-pay | Admitting: Nurse Practitioner

## 2017-06-20 ENCOUNTER — Ambulatory Visit: Payer: BLUE CROSS/BLUE SHIELD | Admitting: Nurse Practitioner

## 2017-06-20 VITALS — BP 112/76 | HR 60 | Temp 97.5°F | Ht 63.0 in | Wt 124.0 lb

## 2017-06-20 DIAGNOSIS — H60501 Unspecified acute noninfective otitis externa, right ear: Secondary | ICD-10-CM | POA: Diagnosis not present

## 2017-06-20 DIAGNOSIS — H6122 Impacted cerumen, left ear: Secondary | ICD-10-CM

## 2017-06-20 MED ORDER — NEOMYCIN-POLYMYXIN-HC 3.5-10000-1 OT SUSP: 3.0000 [drp] | Freq: Three times a day (TID) | OTIC | 0 refills | Status: DC

## 2017-06-20 NOTE — Progress Notes (Signed)
Subjective:  Patient ID: Ana Bean, female    DOB: 03/28/1996  Age: 21 y.o. MRN: [id number]  CC: Ear Pain (right ear pain,presssure,cant hear,felt like its close up/ going on for 2 mo/ use ear clean solution)   Otalgia   There is pain in both ears. This is a new problem. The current episode started in the past 7 days. The problem occurs constantly. The problem has been unchanged. There has been no fever. Associated symptoms include ear discharge and hearing loss. Pertinent negatives include no coughing, neck pain, rash, rhinorrhea or sore throat. She has tried ear drops for the symptoms. The treatment provided no relief. There is no history of a chronic ear infection, hearing loss or a tympanostomy tube.   No outpatient medications prior to visit.   No facility-administered medications prior to visit.    ROS See HPI  Procedure Note :     Procedure :  Cerumen removal   Indication:  Cerumen impaction   Risks, including pain, dizziness, eardrum perforation, bleeding, infection and others as well as benefits were explained to the patient in detail. Verbal consent was obtained and the patient agreed to proceed.    We used a ear loop to manually remove wax from left ear canal.   Tolerated well. Complications: None.   Postprocedure instructions: Call if problems.  Objective:  BP 112/76   Pulse 60   Temp (!) 97.5 F (36.4 C)   Ht 5\' 3"  (1.6 m)   Wt 124 lb (56.2 kg)   SpO2 97%   BMI 21.97 kg/m   BP Readings from Last 3 Encounters:  06/20/17 112/76  11/07/16 110/80  10/07/16 106/72    Wt Readings from Last 3 Encounters:  06/20/17 124 lb (56.2 kg)  11/07/16 113 lb (51.3 kg)  10/07/16 110 lb (49.9 kg)    Physical Exam  Constitutional: She is oriented to person, place, and time.  HENT:  Right Ear: There is drainage.  Left Ear: Tympanic membrane, external ear and ear canal normal.  Unable to visualize right TM.  Neck: Normal range of motion. Neck supple.    Lymphadenopathy:    She has no cervical adenopathy.  Neurological: She is alert and oriented to person, place, and time.  Skin: Skin is warm and dry.    Lab Results  Component Value Date   WBC 8.3 09/27/2016   HGB 14.5 09/27/2016   HCT 41.2 09/27/2016   PLT 262 09/27/2016   GLUCOSE 97 09/27/2016   CHOL 173 11/07/2016   TRIG 58.0 11/07/2016   HDL 82.90 11/07/2016   LDLCALC 79 11/07/2016   ALT 28 09/27/2016   AST 29 09/27/2016   NA 137 09/27/2016   K 3.5 09/27/2016   CL 105 09/27/2016   CREATININE 0.90 09/27/2016   BUN 14 09/27/2016   CO2 22 09/27/2016   TSH 1.57 10/07/2016   HGBA1C 5.5 10/07/2016    No results found.  Assessment & Plan:   Ana Bean was seen today for ear pain.  Diagnoses and all orders for this visit:  Acute otitis externa of right ear, unspecified type -     neomycin-polymyxin-hydrocortisone (CORTISPORIN) 3.5-10000-1 OTIC suspension; Place 3 drops into the right ear 3 (three) times daily.  Impacted cerumen of left ear   I am having Ana Bean start on neomycin-polymyxin-hydrocortisone.  Meds ordered this encounter  Medications  . neomycin-polymyxin-hydrocortisone (CORTISPORIN) 3.5-10000-1 OTIC suspension    Sig: Place 3 drops into the right ear 3 (three) times  daily.    Dispense:  10 mL    Refill:  0    Order Specific Question:   Supervising Provider    Answer:   Lucille Passy [[id number]]    Follow-up: Return in about 8 days (around 06/28/2017) for right ear pain.  Wilfred Lacy, NP

## 2017-06-20 NOTE — Patient Instructions (Signed)

## 2017-06-28 ENCOUNTER — Ambulatory Visit: Payer: BLUE CROSS/BLUE SHIELD | Admitting: Nurse Practitioner

## 2017-06-28 ENCOUNTER — Encounter: Payer: Self-pay | Admitting: Nurse Practitioner

## 2017-06-28 VITALS — BP 110/74 | HR 95 | Temp 97.5°F | Ht 63.0 in | Wt 125.0 lb

## 2017-06-28 DIAGNOSIS — H60501 Unspecified acute noninfective otitis externa, right ear: Secondary | ICD-10-CM

## 2017-06-28 DIAGNOSIS — H66001 Acute suppurative otitis media without spontaneous rupture of ear drum, right ear: Secondary | ICD-10-CM | POA: Diagnosis not present

## 2017-06-28 MED ORDER — AMOXICILLIN 500 MG PO CAPS: 500.0000 mg | ORAL_CAPSULE | Freq: Three times a day (TID) | ORAL | 0 refills | Status: DC

## 2017-06-28 MED ORDER — FLUTICASONE PROPIONATE 50 MCG/ACT NA SUSP: 2.0000 | Freq: Every day | NASAL | 0 refills | Status: DC

## 2017-06-28 NOTE — Progress Notes (Signed)
Subjective:  Patient ID: Ana Bean, female    DOB: 21-May-1996  Age: 21 y.o. MRN: [id number]  CC: Follow-up (follow up on right ear/no pain/discomfort/pressure/head hurt when in moovie)   Ear Drainage   There is pain in the right ear. This is a new problem. The current episode started 1 to 4 weeks ago. The problem occurs constantly. The problem has been gradually improving. There has been no fever. Associated symptoms include ear discharge, headaches and hearing loss. Pertinent negatives include no coughing, diarrhea, neck pain, rash or rhinorrhea. She has tried ear drops and acetaminophen for the symptoms. The treatment provided mild relief.    Outpatient Medications Prior to Visit  Medication Sig Dispense Refill  . neomycin-polymyxin-hydrocortisone (CORTISPORIN) 3.5-10000-1 OTIC suspension Place 3 drops into the right ear 3 (three) times daily. 10 mL 0   No facility-administered medications prior to visit.     ROS See HPI  Objective:  BP 110/74   Pulse 95   Temp (!) 97.5 F (36.4 C)   Ht 5\' 3"  (1.6 m)   Wt 125 lb (56.7 kg)   SpO2 97%   BMI 22.14 kg/m   BP Readings from Last 3 Encounters:  06/28/17 110/74  06/20/17 112/76  11/07/16 110/80    Wt Readings from Last 3 Encounters:  06/28/17 125 lb (56.7 kg)  06/20/17 124 lb (56.2 kg)  11/07/16 113 lb (51.3 kg)    Physical Exam  Constitutional: She is oriented to person, place, and time. No distress.  HENT:  Right Ear: There is drainage. No mastoid tenderness. Decreased hearing is noted.  Left Ear: Tympanic membrane, external ear and ear canal normal. No drainage. No mastoid tenderness. No decreased hearing is noted.  Nose: Nose normal.  Unable to adequately visualize right TM.  Eyes: Conjunctivae and EOM are normal.  Neck: Normal range of motion. Neck supple.  Cardiovascular: Normal rate.  Pulmonary/Chest: Effort normal.  Musculoskeletal: Normal range of motion.  Neurological: She is alert and oriented to  person, place, and time.  Skin: Skin is warm and dry. No rash noted.  Vitals reviewed.   Lab Results  Component Value Date   WBC 8.3 09/27/2016   HGB 14.5 09/27/2016   HCT 41.2 09/27/2016   PLT 262 09/27/2016   GLUCOSE 97 09/27/2016   CHOL 173 11/07/2016   TRIG 58.0 11/07/2016   HDL 82.90 11/07/2016   LDLCALC 79 11/07/2016   ALT 28 09/27/2016   AST 29 09/27/2016   NA 137 09/27/2016   K 3.5 09/27/2016   CL 105 09/27/2016   CREATININE 0.90 09/27/2016   BUN 14 09/27/2016   CO2 22 09/27/2016   TSH 1.57 10/07/2016   HGBA1C 5.5 10/07/2016    No results found.  Assessment & Plan:   Ana Bean was seen today for follow-up.  Diagnoses and all orders for this visit:  Acute otitis externa of right ear, unspecified type -     amoxicillin (AMOXIL) 500 MG capsule; Take 1 capsule (500 mg total) by mouth 3 (three) times daily. -     Ambulatory referral to ENT -     fluticasone (FLONASE) 50 MCG/ACT nasal spray; Place 2 sprays into both nostrils daily.  Acute suppurative otitis media of right ear without spontaneous rupture of tympanic membrane, recurrence not specified -     amoxicillin (AMOXIL) 500 MG capsule; Take 1 capsule (500 mg total) by mouth 3 (three) times daily. -     Ambulatory referral to ENT -  fluticasone (FLONASE) 50 MCG/ACT nasal spray; Place 2 sprays into both nostrils daily.   I am having Ana Bean start on amoxicillin and fluticasone. I am also having her maintain her neomycin-polymyxin-hydrocortisone.  Meds ordered this encounter  Medications  . amoxicillin (AMOXIL) 500 MG capsule    Sig: Take 1 capsule (500 mg total) by mouth 3 (three) times daily.    Dispense:  30 capsule    Refill:  0    Order Specific Question:   Supervising Provider    Answer:   Lucille Passy [[id number]]  . fluticasone (FLONASE) 50 MCG/ACT nasal spray    Sig: Place 2 sprays into both nostrils daily.    Dispense:  16 g    Refill:  0    Order Specific Question:   Supervising Provider      Answer:   Lucille Passy [[id number]]    Follow-up: Return if symptoms worsen or fail to improve.  Wilfred Lacy, NP

## 2017-06-28 NOTE — Patient Instructions (Addendum)
Complete ear drops.  Start amoxicillin tablets as prescribed. Take with food.  You will be contacted to schedule appt with ENT.  [facility] [place], [place], [place] [address]  Phone: [phone]   Otitis Media, Adult Otitis media occurs when there is inflammation and fluid in the middle ear. Your middle ear is a part of the ear that contains bones for hearing as well as air that helps send sounds to your brain. What are the causes? This condition is caused by a blockage in the eustachian tube. This tube drains fluid from the ear to the back of the nose (nasopharynx). A blockage in this tube can be caused by an object or by swelling (edema) in the tube. Problems that can cause a blockage include:  A cold or other upper respiratory infection.  Allergies.  An irritant, such as tobacco smoke.  Enlarged adenoids. The adenoids are areas of soft tissue located high in the back of the throat, behind the nose and the roof of the mouth.  A mass in the nasopharynx.  Damage to the ear caused by pressure changes (barotrauma).  What are the signs or symptoms? Symptoms of this condition include:  Ear pain.  A fever.  Decreased hearing.  A headache.  Tiredness (lethargy).  Fluid leaking from the ear.  Ringing in the ear.  How is this diagnosed? This condition is diagnosed with a physical exam. During the exam your health care provider will use an instrument called an otoscope to look into your ear and check for redness, swelling, and fluid. He or she will also ask about your symptoms. Your health care provider may also order tests, such as:  A test to check the movement of the eardrum (pneumatic otoscopy). This test is done by squeezing a small amount of air into the ear.  A test that changes air pressure in the middle ear to check how well the eardrum moves and whether the eustachian tube is working (tympanogram).  How is this treated? This condition usually goes  away on its own within 3-5 days. But if the condition is caused by a bacteria infection and does not go away own its own, or keeps coming back, your health care provider may:  Prescribe antibiotic medicines to treat the infection.  Prescribe or recommend medicines to control pain.  Follow these instructions at home:  Take over-the-counter and prescription medicines only as told by your health care provider.  If you were prescribed an antibiotic medicine, take it as told by your health care provider. Do not stop taking the antibiotic even if you start to feel better.  Keep all follow-up visits as told by your health care provider. This is important. Contact a health care provider if:  You have bleeding from your nose.  There is a lump on your neck.  You are not getting better in 5 days.  You feel worse instead of better. Get help right away if:  You have severe pain that is not controlled with medicine.  You have swelling, redness, or pain around your ear.  You have stiffness in your neck.  A part of your face is paralyzed.  The bone behind your ear (mastoid) is tender when you touch it.  You develop a severe headache. Summary  Otitis media is redness, soreness, and swelling of the middle ear.  This condition usually goes away on its own within 3-5 days.  If the problem does not go away in 3-5 days, your  health care provider may prescribe or recommend medicines to treat your symptoms.  If you were prescribed an antibiotic medicine, take it as told by your health care provider. This information is not intended to replace advice given to you by your health care provider. Make sure you discuss any questions you have with your health care provider. Document Released: 03/25/2004 Document Revised: 06/10/2016 Document Reviewed: 06/10/2016 Elsevier Interactive Patient Education  [employer].

## 2018-02-01 ENCOUNTER — Ambulatory Visit (INDEPENDENT_AMBULATORY_CARE_PROVIDER_SITE_OTHER): Payer: BC Managed Care – PPO | Admitting: Nurse Practitioner

## 2018-02-01 ENCOUNTER — Encounter: Payer: Self-pay | Admitting: Nurse Practitioner

## 2018-02-01 ENCOUNTER — Other Ambulatory Visit (HOSPITAL_COMMUNITY)
Admission: RE | Admit: 2018-02-01 | Discharge: 2018-02-01 | Disposition: A | Discharge status: Home / Self Care | Payer: BC Managed Care – PPO | Source: Ambulatory Visit | Attending: Nurse Practitioner | Admitting: Nurse Practitioner

## 2018-02-01 VITALS — BP 104/68 | HR 78 | Temp 98.5°F | Ht 63.0 in | Wt 134.0 lb

## 2018-02-01 DIAGNOSIS — Z113 Encounter for screening for infections with a predominantly sexual mode of transmission: Secondary | ICD-10-CM | POA: Diagnosis not present

## 2018-02-01 DIAGNOSIS — N920 Excessive and frequent menstruation with regular cycle: Secondary | ICD-10-CM

## 2018-02-01 DIAGNOSIS — Z0000 Encounter for general adult medical examination without abnormal findings: Secondary | ICD-10-CM | POA: Insufficient documentation

## 2018-02-01 LAB — COMPREHENSIVE METABOLIC PANEL
ALBUMIN: 4.4 g/dL (ref 3.5–5.2)
ALK PHOS: 56 U/L (ref 39–117)
ALT: 18 U/L (ref 0–35)
AST: 19 U/L (ref 0–37)
BUN: 16 mg/dL (ref 6–23)
CO2: 27 mEq/L (ref 19–32)
Calcium: 9.4 mg/dL (ref 8.4–10.5)
Chloride: 102 mEq/L (ref 96–112)
Creatinine, Ser: 0.95 mg/dL (ref 0.40–1.20)
GFR: 93.74 mL/min (ref >60.00)
GLUCOSE: 90 mg/dL (ref 70–99)
POTASSIUM: 3.7 meq/L (ref 3.5–5.1)
Sodium: 136 mEq/L (ref 135–145)
TOTAL PROTEIN: 7.5 g/dL (ref 6.0–8.3)
Total Bilirubin: 0.6 mg/dL (ref 0.2–1.2)

## 2018-02-01 LAB — CBC
HEMATOCRIT: 40.2 % (ref 36.0–46.0)
HEMOGLOBIN: 13.7 g/dL (ref 12.0–15.0)
MCHC: 34 g/dL (ref 30.0–36.0)
MCV: 92.5 fl (ref 78.0–100.0)
PLATELETS: 304 10*3/uL (ref 150.0–400.0)
RBC: 4.35 Mil/uL (ref 3.87–5.11)
RDW: 14.5 % (ref 11.5–15.5)
WBC: 3.4 10*3/uL — AB (ref 4.0–10.5)

## 2018-02-01 LAB — IBC PANEL
IRON: 79 ug/dL (ref 42–145)
SATURATION RATIOS: 16.9 % — AB (ref 20.0–50.0)
Transferrin: 334 mg/dL (ref 212.0–360.0)

## 2018-02-01 NOTE — Patient Instructions (Addendum)
I encourage safe sex practices like use of condoms at all times or abstinence.  Maintain heart healthy diet and regular exercise regimen.  Need to schedule dental cleaning every 18month.  Stable cbc and cmp. Mild decrease in iron saturation. Use prenatal multivitamin with iron. Wet prep is negative for BV, candida, GC/chlamydia, and trichomoniasis  Preventive Care for Ana Bean The transition to life after high school as a young adult can be a stressful time with many changes. You may start seeing a primary care physician instead of a pediatrician. This is the time when your health care becomes your responsibility. Preventive care refers to lifestyle choices and visits with your health care provider that can promote health and wellness. What does preventive care include?  A yearly physical exam. This is also called an annual wellness visit.  Dental exams once or twice a year.  Routine eye exams. Ask your health care provider how often you should have your eyes checked.  Personal lifestyle choices, including: ? Daily care of your teeth and gums. ? Regular physical activity. ? Eating a healthy diet. ? Avoiding tobacco and drug use. ? Avoiding or limiting alcohol use. ? Practicing safe sex. ? Taking vitamin and mineral supplements as recommended by your health care provider. What happens during an annual wellness visit? Preventive care starts with a yearly visit to your primary care physician. The services and screenings done by your health care provider during your annual wellness visit will depend on your overall health, lifestyle risk factors, and family history of disease. Counseling Your health care provider may ask you questions about:  Past medical problems and your family's medical history.  Medicines or supplements you take.  Health insurance and access to health care.  Alcohol, tobacco, and drug use.  Your safety at home, work, or school.  Access to  firearms.  Emotional well-being and how you cope with stress.  Relationship well-being.  Diet, exercise, and sleep habits.  Your sexual health and activity.  Your methods of birth control.  Your menstrual cycle.  Your pregnancy history.  Screening You may have the following tests or measurements:  Height, weight, and BMI.  Blood pressure.  Lipid and cholesterol levels.  Tuberculosis skin test.  Skin exam.  Vision and hearing tests.  Screening test for hepatitis.  Screening tests for sexually transmitted diseases (STDs), if you are at risk.  BRCA-related cancer screening. This may be done if you have a family history of breast, ovarian, tubal, or peritoneal cancers.  Pelvic exam and Pap test. This may be done every 3 years starting at age 525  Vaccines Your health care provider may recommend certain vaccines, such as:  Influenza vaccine. This is recommended every year.  Tetanus, diphtheria, and acellular pertussis (Tdap, Td) vaccine. You may need a Td booster every 10 years.  Varicella vaccine. You may need this if you have not been vaccinated.  HPV vaccine. If you are 2103or younger, you may need three doses over 6 months.  Measles, mumps, and rubella (MMR) vaccine. You may need at least one dose of MMR. You may also need a second dose.  Pneumococcal 13-valent conjugate (PCV13) vaccine. You may need this if you have certain conditions and were not previously vaccinated.  Pneumococcal polysaccharide (PPSV23) vaccine. You may need one or two doses if you smoke cigarettes or if you have certain conditions.  Meningococcal vaccine. One dose is recommended if you are age 52-21 years and a fMarket researcherliving in  a residence hall, or if you have one of several medical conditions. You may also need additional booster doses.  Hepatitis A vaccine. You may need this if you have certain conditions or if you travel or work in places where you may be exposed to  hepatitis A.  Hepatitis B vaccine. You may need this if you have certain conditions or if you travel or work in places where you may be exposed to hepatitis B.  Haemophilus influenzae type b (Hib) vaccine. You may need this if you have certain risk factors.  Talk to your health care provider about which screenings and vaccines you need and how often you need them. What steps can I take to develop healthy behaviors?  Have regular preventive health care visits with your primary care physician and dentist.  Eat a healthy diet.  Drink enough fluid to keep your urine clear or pale yellow.  Stay active. Exercise at least 30 minutes 5 or more days of the week.  Use alcohol responsibly.  Maintain a healthy weight.  Do not use any products that contain nicotine, such as cigarettes, chewing tobacco, and e-cigarettes. If you need help quitting, ask your health care provider.  Do not use drugs.  Practice safe sex.  Use birth control (contraception) to prevent unwanted pregnancy. If you plan to become pregnant, see your health care provider for a pre-conception visit.  Find healthy ways to manage stress. How can I protect myself from injury? Injuries from violence or accidents are the leading cause of death among young adults and can often be prevented. Take these steps to help protect yourself:  Always wear your seat belt while driving or riding in a vehicle.  Do not drive if you have been drinking alcohol. Do not ride with someone who has been drinking.  Do not drive when you are tired or distracted. Do not text while driving.  Wear a helmet and other protective equipment during sports activities.  If you have firearms in your house, make sure you follow all gun safety procedures.  Seek help if you have been bullied, physically abused, or sexually abused.  Use the Internet responsibly to avoid dangers such as online bullying and online sexual predators.  What can I do to cope with  stress? Young adults may face many new challenges that can be stressful, such as finding a job, going to college, moving away from home, managing money, being in a relationship, getting married, and having children. To manage stress:  Avoid known stressful situations when you can.  Exercise regularly.  Find a stress-reducing activity that works best for you. Examples include meditation, yoga, listening to music, or reading.  Spend time in nature.  Keep a journal to write about your stress and how you respond.  Talk to your health care provider about stress. He or she may suggest counseling.  Spend time with supportive friends or family.  Do not cope with stress by: ? Drinking alcohol or using drugs. ? Smoking cigarettes. ? Eating.  Where can I get more information? Learn more about preventive care and healthy habits from:  [employer] and Gynecologists: KaraokeExchange.nl  U.[place] Probation officer Task Force: StageSync.si  National Adolescent and [place]: StrategicRoad.nl  American Academy of Pediatrics Bright Futures: https://brightfutures.MemberVerification.co.za  Society for Adolescent Health and Medicine: MoralBlog.co.za.aspx  PodExchange.nl: ToyLending.fr  This information is not intended to replace advice given to you by your health care provider. Make sure you discuss any questions  you have with your health care provider. Document Released: 11/05/2015 Document Revised: 11/26/2015 Document Reviewed: 11/05/2015 Elsevier Interactive Patient Education  [employer].

## 2018-02-01 NOTE — Progress Notes (Signed)
Subjective:    Patient ID: Ana Bean, female    DOB: [birth year], 22 y.o.   MRN: [id number]  Patient presents today for complete physical  Exposure to STD   The patient's pertinent negatives include no discharge, dyspareunia, dysuria, genital itching, genital lesions, genital rash or pelvic pain. This is a new problem. The current episode started 1 to 4 weeks ago. The problem has been unchanged. The vaginal discharge was normal. Pertinent negatives include no abdominal pain, anorexia, diaphoresis, fever, genital odor, rectal pain, sore throat or urinary frequency. She has tried antibiotics for the symptoms. Risk factors include history of STDs.  Tested positive for chlamydia last month per Dr. Leo Grosser. She was asymptomatic, Treated with oral abx Has not been sexually active for last 62months.  She also complains of Heavy menstrual cycle with clots (size of quarter), regular cycle, some dysmenorrhea. Has noticed increased amount of clots with use of tampons, so she switched to pads. Report transvaginal and pelvic US done by GYN: Dr. Leo Grosser last month. She has not make f/up to discuss findings.  LMP 01/25/2018. Hx of uterine fibroids (mother and grandmother)  Sexual History (orientation,birth control, marital status, STD):sexually active, female partner, inconsistent use of condoms  Depression/Suicide: Depression screen [facility] 2/9 02/01/2018 10/07/2016  Decreased Interest 0 0  Down, Depressed, Hopeless 0 0  PHQ - 2 Score 0 0   No flowsheet data found.  Vision:not needed per patient  Dental:not done, will schedule  Immunizations: (TDAP, Hep C screen, Pneumovax, Influenza, zoster)  Health Maintenance  Topic Date Due  . Flu Shot  02/28/2018*  . Pap Smear  11/08/2019  . Tetanus Vaccine  11/08/2026  . HIV Screening  Completed  *Topic was postponed. The date shown is not the original due date.   Diet:regular.  Weight:  Wt Readings from Last 3 Encounters:  02/01/18 134 lb (60.8 kg)    06/28/17 125 lb (56.7 kg)  06/20/17 124 lb (56.2 kg)    Exercise:none  Fall Risk: Fall Risk  02/01/2018 10/07/2016  Falls in the past year? No No   Home Safety:home with parents, works full-time,   Medications and allergies reviewed with patient and updated if appropriate.  Patient Active Problem List   Diagnosis Date Noted  . Breast lump on right side at 9 o'clock position 11/07/2016  . Breast lump on right side at 6 o'clock position 11/07/2016  . Anxiety 10/07/2016  . Weight loss 10/07/2016  . Weight loss, unintentional 10/07/2016  . Night sweats 10/07/2016    No current outpatient medications on file prior to visit.   No current facility-administered medications on file prior to visit.     No past medical history on file.  No past surgical history on file.  Social History   Socioeconomic History  . Marital status: Single    Spouse name: Not on file  . Number of children: Not on file  . Years of education: Not on file  . Highest education level: Not on file  Occupational History  . Not on file  Social Needs  . Financial resource strain: Not on file  . Food insecurity:    Worry: Not on file    Inability: Not on file  . Transportation needs:    Medical: Not on file    Non-medical: Not on file  Tobacco Use  . Smoking status: Never Smoker  . Smokeless tobacco: Never Used  Substance and Sexual Activity  . Alcohol use: No  . Drug use: No  .  Sexual activity: Not on file  Lifestyle  . Physical activity:    Days per week: Not on file    Minutes per session: Not on file  . Stress: Not on file  Relationships  . Social connections:    Talks on phone: Not on file    Gets together: Not on file    Attends religious service: Not on file    Active member of club or organization: Not on file    Attends meetings of clubs or organizations: Not on file    Relationship status: Not on file  Other Topics Concern  . Not on file  Social History Narrative  . Not on file     Family History  Problem Relation Age of Onset  . Cancer Maternal Grandmother        breast cancer       Review of Systems  Constitutional: Negative for diaphoresis, fever, malaise/fatigue and weight loss.  HENT: Negative for congestion and sore throat.   Eyes:       Negative for visual changes  Respiratory: Negative for cough and shortness of breath.   Cardiovascular: Negative for chest pain, palpitations and leg swelling.  Gastrointestinal: Negative for abdominal pain, anorexia, blood in stool, constipation, diarrhea, heartburn and rectal pain.  Genitourinary: Negative for dyspareunia, dysuria, frequency, pelvic pain and urgency.  Musculoskeletal: Negative for falls, joint pain and myalgias.  Skin: Negative for rash.  Neurological: Negative for dizziness, sensory change and headaches.  Endo/Heme/Allergies: Does not bruise/bleed easily.  Psychiatric/Behavioral: Negative for depression, substance abuse and suicidal ideas. The patient is not nervous/anxious.     Objective:   Vitals:   02/01/18 0919  BP: 104/68  Pulse: 78  Temp: 98.5 F (36.9 C)  SpO2: 98%    Body mass index is 23.74 kg/m.   Physical Examination:  Physical Exam  Constitutional: She is oriented to person, place, and time. She appears well-developed and well-nourished.  HENT:  Right Ear: External ear normal.  Left Ear: External ear normal.  Nose: Nose normal.  Mouth/Throat: Oropharynx is clear and moist.  Eyes: Pupils are equal, round, and reactive to light. EOM are normal.  Neck: Normal range of motion. Neck supple. No thyromegaly present.  Cardiovascular: Normal rate, regular rhythm, normal heart sounds and intact distal pulses.  Abdominal: Soft. Bowel sounds are normal. She exhibits no distension. There is no tenderness.  Genitourinary: Rectum normal and vagina normal. Rectal exam shows no external hemorrhoid. Pelvic exam was performed with patient supine. There is no rash, tenderness or lesion  on the right labia. There is no rash, tenderness or lesion on the left labia. Cervix exhibits no discharge and no friability. Right adnexum displays no tenderness. Left adnexum displays no tenderness. No erythema, tenderness or bleeding in the vagina. No vaginal discharge found.  Genitourinary Comments: Chaperone present  Musculoskeletal: Normal range of motion.  Lymphadenopathy:    She has no cervical adenopathy.  Neurological: She is alert and oriented to person, place, and time.  Skin: Skin is warm and dry. No rash noted.  Psychiatric: She has a normal mood and affect. Her behavior is normal. Thought content normal.  Vitals reviewed.  ASSESSMENT and PLAN:  Ana Bean was seen today for annual exam.  Diagnoses and all orders for this visit:  Encounter for preventive health examination -     Comprehensive metabolic panel  Routine screening for STI (sexually transmitted infection) -     Cervicovaginal ancillary only  Menorrhagia with regular cycle -  CBC -     IBC panel   No problem-specific Assessment & Plan notes found for this encounter.     Follow up: Return if symptoms worsen or fail to improve.  Wilfred Lacy, NP

## 2018-02-02 LAB — CERVICOVAGINAL ANCILLARY ONLY
Bacterial vaginitis: NEGATIVE
CANDIDA VAGINITIS: NEGATIVE
CHLAMYDIA, DNA PROBE: NEGATIVE
Neisseria Gonorrhea: NEGATIVE
Trichomonas: NEGATIVE

## 2018-02-05 ENCOUNTER — Encounter: Payer: Self-pay | Admitting: Nurse Practitioner

## 2018-05-12 ENCOUNTER — Encounter (HOSPITAL_COMMUNITY): Payer: Self-pay | Admitting: Nurse Practitioner

## 2018-05-12 ENCOUNTER — Emergency Department (HOSPITAL_COMMUNITY)
Admission: EM | Admit: 2018-05-12 | Discharge: 2018-05-12 | Disposition: A | Discharge status: Home / Self Care | Payer: BC Managed Care – PPO | Attending: Emergency Medicine | Admitting: Emergency Medicine

## 2018-05-12 ENCOUNTER — Emergency Department (HOSPITAL_COMMUNITY): Payer: BC Managed Care – PPO

## 2018-05-12 DIAGNOSIS — Y998 Other external cause status: Secondary | ICD-10-CM | POA: Insufficient documentation

## 2018-05-12 DIAGNOSIS — Y92410 Unspecified street and highway as the place of occurrence of the external cause: Secondary | ICD-10-CM | POA: Diagnosis not present

## 2018-05-12 DIAGNOSIS — S299XXA Unspecified injury of thorax, initial encounter: Secondary | ICD-10-CM | POA: Diagnosis not present

## 2018-05-12 DIAGNOSIS — S8991XA Unspecified injury of right lower leg, initial encounter: Secondary | ICD-10-CM | POA: Diagnosis not present

## 2018-05-12 DIAGNOSIS — S4992XA Unspecified injury of left shoulder and upper arm, initial encounter: Secondary | ICD-10-CM | POA: Insufficient documentation

## 2018-05-12 DIAGNOSIS — Y93I9 Activity, other involving external motion: Secondary | ICD-10-CM | POA: Diagnosis not present

## 2018-05-12 MED ORDER — KETOROLAC TROMETHAMINE 15 MG/ML IJ SOLN
15.0000 mg | Freq: Once | INTRAMUSCULAR | Status: AC
  Administered 2018-05-12: 15 mg via INTRAMUSCULAR
  Filled 2018-05-12: qty 1
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
## 2020-01-02 ENCOUNTER — Encounter: Payer: BC Managed Care – PPO | Admitting: Nurse Practitioner

## 2020-01-07 ENCOUNTER — Other Ambulatory Visit: Payer: Self-pay

## 2020-01-08 ENCOUNTER — Encounter: Payer: Self-pay | Admitting: Nurse Practitioner

## 2020-01-08 ENCOUNTER — Ambulatory Visit (INDEPENDENT_AMBULATORY_CARE_PROVIDER_SITE_OTHER): Payer: BC Managed Care – PPO | Admitting: Nurse Practitioner

## 2020-01-08 ENCOUNTER — Other Ambulatory Visit (HOSPITAL_COMMUNITY)
Admission: RE | Admit: 2020-01-08 | Discharge: 2020-01-08 | Disposition: A | Discharge status: Home / Self Care | Payer: BC Managed Care – PPO | Source: Ambulatory Visit | Attending: Nurse Practitioner | Admitting: Nurse Practitioner

## 2020-01-08 VITALS — BP 110/60 | HR 80 | Temp 97.4°F | Ht 63.0 in | Wt 167.0 lb

## 2020-01-08 DIAGNOSIS — K589 Irritable bowel syndrome without diarrhea: Secondary | ICD-10-CM | POA: Insufficient documentation

## 2020-01-08 DIAGNOSIS — Z124 Encounter for screening for malignant neoplasm of cervix: Secondary | ICD-10-CM

## 2020-01-08 DIAGNOSIS — Z136 Encounter for screening for cardiovascular disorders: Secondary | ICD-10-CM

## 2020-01-08 DIAGNOSIS — Z13220 Encounter for screening for lipoid disorders: Secondary | ICD-10-CM

## 2020-01-08 DIAGNOSIS — N946 Dysmenorrhea, unspecified: Secondary | ICD-10-CM | POA: Insufficient documentation

## 2020-01-08 DIAGNOSIS — Z113 Encounter for screening for infections with a predominantly sexual mode of transmission: Secondary | ICD-10-CM | POA: Insufficient documentation

## 2020-01-08 DIAGNOSIS — D500 Iron deficiency anemia secondary to blood loss (chronic): Secondary | ICD-10-CM | POA: Diagnosis not present

## 2020-01-08 DIAGNOSIS — N926 Irregular menstruation, unspecified: Secondary | ICD-10-CM | POA: Insufficient documentation

## 2020-01-08 DIAGNOSIS — Z0000 Encounter for general adult medical examination without abnormal findings: Secondary | ICD-10-CM

## 2020-01-08 LAB — CBC WITH DIFFERENTIAL/PLATELET
Basophils Absolute: 0 10*3/uL (ref 0.0–0.1)
Basophils Relative: 0.4 % (ref 0.0–3.0)
Eosinophils Absolute: 0.1 10*3/uL (ref 0.0–0.7)
Eosinophils Relative: 1.5 % (ref 0.0–5.0)
HCT: 40.1 % (ref 36.0–46.0)
Hemoglobin: 13.4 g/dL (ref 12.0–15.0)
Lymphocytes Relative: 43.1 % (ref 12.0–46.0)
Lymphs Abs: 1.7 10*3/uL (ref 0.7–4.0)
MCHC: 33.5 g/dL (ref 30.0–36.0)
MCV: 91.7 fl (ref 78.0–100.0)
Monocytes Absolute: 0.5 10*3/uL (ref 0.1–1.0)
Monocytes Relative: 13.5 % — ABNORMAL HIGH (ref 3.0–12.0)
Neutro Abs: 1.6 10*3/uL (ref 1.4–7.7)
Neutrophils Relative %: 41.5 % — ABNORMAL LOW (ref 43.0–77.0)
Platelets: 271 10*3/uL (ref 150.0–400.0)
RBC: 4.37 Mil/uL (ref 3.87–5.11)
RDW: 14.1 % (ref 11.5–15.5)
WBC: 3.9 10*3/uL — ABNORMAL LOW (ref 4.0–10.5)

## 2020-01-08 LAB — COMPREHENSIVE METABOLIC PANEL
ALT: 18 U/L (ref 0–35)
AST: 18 U/L (ref 0–37)
Albumin: 4.4 g/dL (ref 3.5–5.2)
Alkaline Phosphatase: 63 U/L (ref 39–117)
BUN: 14 mg/dL (ref 6–23)
CO2: 24 mEq/L (ref 19–32)
Calcium: 9.2 mg/dL (ref 8.4–10.5)
Chloride: 104 mEq/L (ref 96–112)
Creatinine, Ser: 0.84 mg/dL (ref 0.40–1.20)
GFR: 100 mL/min (ref >60.00)
Glucose, Bld: 90 mg/dL (ref 70–99)
Potassium: 3.7 mEq/L (ref 3.5–5.1)
Sodium: 136 mEq/L (ref 135–145)
Total Bilirubin: 0.6 mg/dL (ref 0.2–1.2)
Total Protein: 7 g/dL (ref 6.0–8.3)

## 2020-01-08 LAB — TSH: TSH: 1.22 u[IU]/mL (ref 0.35–4.50)

## 2020-01-08 LAB — LIPID PANEL
Cholesterol: 177 mg/dL (ref 0–200)
HDL: 54.8 mg/dL (ref >39.00)
LDL Cholesterol: 113 mg/dL — ABNORMAL HIGH (ref 0–99)
NonHDL: 122.41
Total CHOL/HDL Ratio: 3
Triglycerides: 48 mg/dL (ref 0.0–149.0)
VLDL: 9.6 mg/dL (ref 0.0–40.0)

## 2020-01-08 NOTE — Assessment & Plan Note (Signed)
Heavy menstrual cycle, every 28days, last for 4days, clots present,, dysmenorrhea. LMP 12/12/2019 She is not interested in hormonal contraception at this time. Use of tylenol and ibuprofen prn to manage pain.  Check CBC and iron panel today

## 2020-01-08 NOTE — Patient Instructions (Signed)
Go to lab for blood draw   Health Maintenance, Female Adopting a healthy lifestyle and getting preventive care are important in promoting health and wellness. Ask your health care provider about:  The right schedule for you to have regular tests and exams.  Things you can do on your own to prevent diseases and keep yourself healthy. What should I know about diet, weight, and exercise? Eat a healthy diet   Eat a diet that includes plenty of vegetables, fruits, low-fat dairy products, and lean protein.  Do not eat a lot of foods that are high in solid fats, added sugars, or sodium. Maintain a healthy weight Body mass index (BMI) is used to identify weight problems. It estimates body fat based on height and weight. Your health care provider can help determine your BMI and help you achieve or maintain a healthy weight. Get regular exercise Get regular exercise. This is one of the most important things you can do for your health. Most adults should:  Exercise for at least 150 minutes each week. The exercise should increase your heart rate and make you sweat (moderate-intensity exercise).  Do strengthening exercises at least twice a week. This is in addition to the moderate-intensity exercise.  Spend less time sitting. Even light physical activity can be beneficial. Watch cholesterol and blood lipids Have your blood tested for lipids and cholesterol at 25 years of age, then have this test every 5 years. Have your cholesterol levels checked more often if:  Your lipid or cholesterol levels are high.  You are older than 25 years of age.  You are at high risk for heart disease. What should I know about cancer screening? Depending on your health history and family history, you may need to have cancer screening at various ages. This may include screening for:  Breast cancer.  Cervical cancer.  Colorectal cancer.  Skin cancer.  Lung cancer. What should I know about heart disease,  diabetes, and high blood pressure? Blood pressure and heart disease  High blood pressure causes heart disease and increases the risk of stroke. This is more likely to develop in people who have high blood pressure readings, are of African descent, or are overweight.  Have your blood pressure checked: ? Every 3-5 years if you are 46-38 years of age. ? Every year if you are 70 years old or older. Diabetes Have regular diabetes screenings. This checks your fasting blood sugar level. Have the screening done:  Once every three years after age 50 if you are at a normal weight and have a low risk for diabetes.  More often and at a younger age if you are overweight or have a high risk for diabetes. What should I know about preventing infection? Hepatitis B If you have a higher risk for hepatitis B, you should be screened for this virus. Talk with your health care provider to find out if you are at risk for hepatitis B infection. Hepatitis C Testing is recommended for:  Everyone born from [birth year] through 1966.  Anyone with known risk factors for hepatitis C. Sexually transmitted infections (STIs)  Get screened for STIs, including gonorrhea and chlamydia, if: ? You are sexually active and are younger than 25 years of age. ? You are older than 25 years of age and your health care provider tells you that you are at risk for this type of infection. ? Your sexual activity has changed since you were last screened, and you are at increased risk for chlamydia  or gonorrhea. Ask your health care provider if you are at risk.  Ask your health care provider about whether you are at high risk for HIV. Your health care provider may recommend a prescription medicine to help prevent HIV infection. If you choose to take medicine to prevent HIV, you should first get tested for HIV. You should then be tested every 3 months for as long as you are taking the medicine. Pregnancy  If you are about to stop having your  period (premenopausal) and you may become pregnant, seek counseling before you get pregnant.  Take 400 to 800 micrograms (mcg) of folic acid every day if you become pregnant.  Ask for birth control (contraception) if you want to prevent pregnancy. Osteoporosis and menopause Osteoporosis is a disease in which the bones lose minerals and strength with aging. This can result in bone fractures. If you are 53 years old or older, or if you are at risk for osteoporosis and fractures, ask your health care provider if you should:  Be screened for bone loss.  Take a calcium or vitamin D supplement to lower your risk of fractures.  Be given hormone replacement therapy (HRT) to treat symptoms of menopause. Follow these instructions at home: Lifestyle  Do not use any products that contain nicotine or tobacco, such as cigarettes, e-cigarettes, and chewing tobacco. If you need help quitting, ask your health care provider.  Do not use street drugs.  Do not share needles.  Ask your health care provider for help if you need support or information about quitting drugs. Alcohol use  Do not drink alcohol if: ? Your health care provider tells you not to drink. ? You are pregnant, may be pregnant, or are planning to become pregnant.  If you drink alcohol: ? Limit how much you use to 0-1 drink a day. ? Limit intake if you are breastfeeding.  Be aware of how much alcohol is in your drink. In the [place] one drink equals one 12 oz bottle of beer (355 mL), one 5 oz glass of wine (148 mL), or one 1 oz glass of hard liquor (44 mL). General instructions  Schedule regular health, dental, and eye exams.  Stay current with your vaccines.  Tell your health care provider if: ? You often feel depressed. ? You have ever been abused or do not feel safe at home. Summary  Adopting a healthy lifestyle and getting preventive care are important in promoting health and wellness.  Follow your health care provider's  instructions about healthy diet, exercising, and getting tested or screened for diseases.  Follow your health care provider's instructions on monitoring your cholesterol and blood pressure. This information is not intended to replace advice given to you by your health care provider. Make sure you discuss any questions you have with your health care provider. Document Revised: 06/13/2018 Document Reviewed: 06/13/2018 Elsevier Patient Education  2020 [employer].

## 2020-01-08 NOTE — Progress Notes (Signed)
Subjective:    Patient ID: Ana Bean, female    DOB: [birth year], 24 y.o.   MRN: [id number]  Patient presents today for CPE or establish care (new patient)   HPI Iron deficiency anemia due to chronic blood loss Heavy menstrual cycle, every 28days, last for 4days, clots present,, dysmenorrhea. LMP 12/12/2019 She is not interested in hormonal contraception at this time. Use of tylenol and ibuprofen prn to manage pain.  Check CBC and iron panel today  Sexual History (orientation,birth control, marital status, STD):sexually active, LMP 12/12/2019, no contraception at this time, PAP and breast exam needed today  Depression/Suicide: Depression screen [facility] 2/9 01/08/2020 02/01/2018 10/07/2016  Decreased Interest 0 0 0  Down, Depressed, Hopeless 0 0 0  PHQ - 2 Score 0 0 0   Vision:not needed per patient  Dental:up to date  Immunizations: (TDAP, Hep C screen, Pneumovax, Influenza, zoster)  Health Maintenance  Topic Date Due  .  Hepatitis C: One time screening is recommended by Center for Disease Control  (CDC) for  adults born from 18 through 1966.   Never done  . Pap Smear  11/08/2019  . Pap Smear  11/08/2019  . Flu Shot  02/02/2020  . Tetanus Vaccine  11/08/2026  . HIV Screening  Completed   Diet:regular  Weight:  Wt Readings from Last 3 Encounters:  01/08/20 167 lb (75.8 kg)  02/01/18 134 lb (60.8 kg)  06/28/17 125 lb (56.7 kg)    Fall Risk: Fall Risk  01/08/2020 02/01/2018 10/07/2016  Falls in the past year? - No No  Number falls in past yr: 0 - -  Injury with Fall? 0 - -   Medications and allergies reviewed with patient and updated if appropriate.  Patient Active Problem List   Diagnosis Date Noted  . Dysmenorrhea 01/08/2020  . Irritable bowel syndrome 01/08/2020  . Iron deficiency anemia due to chronic blood loss 01/08/2020  . Breast lump on right side at 6 o'clock position 11/07/2016  . Anxiety 10/07/2016  . Breast lump 07/04/2013    No current outpatient  medications on file prior to visit.   No current facility-administered medications on file prior to visit.    History reviewed. No pertinent past medical history.  History reviewed. No pertinent surgical history.  Social History   Socioeconomic History  . Marital status: Single    Spouse name: Not on file  . Number of children: Not on file  . Years of education: Not on file  . Highest education level: Not on file  Occupational History  . Not on file  Tobacco Use  . Smoking status: Never Smoker  . Smokeless tobacco: Never Used  Substance and Sexual Activity  . Alcohol use: No  . Drug use: No  . Sexual activity: Not on file  Other Topics Concern  . Not on file  Social History Narrative  . Not on file   Social Determinants of Health   Financial Resource Strain:   . Difficulty of Paying Living Expenses:   Food Insecurity:   . Worried About Charity fundraiser in the Last Year:   . Arboriculturist in the Last Year:   Transportation Needs:   . Film/video editor (Medical):   Marland Kitchen Lack of Transportation (Non-Medical):   Physical Activity:   . Days of Exercise per Week:   . Minutes of Exercise per Session:   Stress:   . Feeling of Stress :   Social Connections:   .  Frequency of Communication with Friends and Family:   . Frequency of Social Gatherings with Friends and Family:   . Attends Religious Services:   . Active Member of Clubs or Organizations:   . Attends Archivist Meetings:   Marland Kitchen Marital Status:     Family History  Problem Relation Age of Onset  . Cancer Maternal Grandmother        breast cancer  . Cancer Maternal Grandfather        stomach       Review of Systems  Constitutional: Negative for fever, malaise/fatigue and weight loss.  HENT: Negative for congestion and sore throat.   Eyes:       Negative for visual changes  Respiratory: Negative for cough and shortness of breath.   Cardiovascular: Negative for chest pain, palpitations and  leg swelling.  Gastrointestinal: Negative for blood in stool, constipation, diarrhea and heartburn.  Genitourinary: Negative for dysuria, frequency and urgency.  Musculoskeletal: Negative for falls, joint pain and myalgias.  Skin: Negative for rash.  Neurological: Negative for dizziness, sensory change and headaches.  Endo/Heme/Allergies: Does not bruise/bleed easily.  Psychiatric/Behavioral: Negative for depression, substance abuse and suicidal ideas. The patient is not nervous/anxious.     Objective:   Vitals:   01/08/20 0933  BP: 110/60  Pulse: 80  Temp: (!) 97.4 F (36.3 C)  SpO2: 99%    Body mass index is 29.58 kg/m.   Physical Examination:  Physical Exam Exam conducted with a chaperone present.  Constitutional:      General: She is not in acute distress. HENT:     Right Ear: External ear normal.     Left Ear: External ear normal.     Nose: Nose normal.     Mouth/Throat:     Pharynx: No oropharyngeal exudate.  Eyes:     General: No scleral icterus.    Conjunctiva/sclera: Conjunctivae normal.     Pupils: Pupils are equal, round, and reactive to light.  Neck:     Thyroid: No thyromegaly.  Cardiovascular:     Rate and Rhythm: Normal rate.     Heart sounds: Normal heart sounds.  Pulmonary:     Effort: Pulmonary effort is normal.     Breath sounds: Normal breath sounds.  Chest:     Chest wall: No tenderness.     Breasts:        Right: Normal.        Left: Normal.  Abdominal:     General: Bowel sounds are normal. There is no distension.     Palpations: Abdomen is soft.     Tenderness: There is no abdominal tenderness.     Hernia: There is no hernia in the left inguinal area or right inguinal area.  Genitourinary:    General: Normal vulva.     Labia:        Right: No rash or tenderness.        Left: No rash or tenderness.      Urethra: No prolapse.     Vagina: Normal. No vaginal discharge.     Cervix: Normal.     Uterus: Normal.      Adnexa: Right  adnexa normal and left adnexa normal.     Rectum: No external hemorrhoid.  Musculoskeletal:        General: No tenderness. Normal range of motion.     Cervical back: Normal range of motion and neck supple.     Right lower leg: No edema.  Left lower leg: No edema.  Lymphadenopathy:     Cervical: No cervical adenopathy.     Upper Body:     Right upper body: No supraclavicular, axillary or pectoral adenopathy.     Left upper body: No supraclavicular, axillary or pectoral adenopathy.     Lower Body: No right inguinal adenopathy. No left inguinal adenopathy.  Skin:    General: Skin is warm and dry.  Neurological:     Mental Status: She is alert and oriented to person, place, and time.  Psychiatric:        Judgment: Judgment normal.     ASSESSMENT and PLAN: This visit occurred during the SARS-CoV-2 public health emergency.  Safety protocols were in place, including screening questions prior to the visit, additional usage of staff PPE, and extensive cleaning of exam room while observing appropriate contact time as indicated for disinfecting solutions.   Ana Bean was seen today for annual exam.  Diagnoses and all orders for this visit:  Encounter for preventive health examination -     CBC with Differential/Platelet -     Comprehensive metabolic panel -     Lipid panel -     TSH  Encounter for lipid screening for cardiovascular disease -     Lipid panel  Screening examination for STD (sexually transmitted disease) -     HIV antibody (with reflex) -     Cervicovaginal ancillary only( [place]) -     metroNIDAZOLE (METROGEL VAGINAL) 0.75 % vaginal gel; Place 1 Applicatorful vaginally at bedtime.  Iron deficiency anemia due to chronic blood loss -     Iron, TIBC and Ferritin Panel  Encounter for Papanicolaou smear for cervical cancer screening -     Cytology - PAP( [place])        Problem List Items Addressed This Visit      Other   Iron deficiency anemia due to  chronic blood loss    Heavy menstrual cycle, every 28days, last for 4days, clots present,, dysmenorrhea. LMP 12/12/2019 She is not interested in hormonal contraception at this time. Use of tylenol and ibuprofen prn to manage pain.  Check CBC and iron panel today      Relevant Orders   Iron, TIBC and Ferritin Panel (Completed)    Other Visit Diagnoses    Encounter for preventive health examination    -  Primary   Relevant Orders   CBC with Differential/Platelet (Completed)   Comprehensive metabolic panel (Completed)   Lipid panel (Completed)   TSH (Completed)   Encounter for lipid screening for cardiovascular disease       Relevant Orders   Lipid panel (Completed)   Screening examination for STD (sexually transmitted disease)       Relevant Medications   metroNIDAZOLE (METROGEL VAGINAL) 0.75 % vaginal gel   Other Relevant Orders   HIV antibody (with reflex) (Completed)   Cervicovaginal ancillary only( [place]) (Completed)   Encounter for Papanicolaou smear for cervical cancer screening       Relevant Orders   Cytology - PAP( [place])      Follow up: Return in about 1 year (around 01/07/2021) for CPE (fasting).  Wilfred Lacy, NP

## 2020-01-09 LAB — CERVICOVAGINAL ANCILLARY ONLY
Bacterial Vaginitis (gardnerella): POSITIVE — AB
Candida Glabrata: NEGATIVE
Candida Vaginitis: NEGATIVE
Chlamydia: NEGATIVE
Comment: NEGATIVE
Comment: NEGATIVE
Comment: NEGATIVE
Comment: NEGATIVE
Comment: NEGATIVE
Comment: NORMAL
Neisseria Gonorrhea: NEGATIVE
Trichomonas: NEGATIVE

## 2020-01-09 LAB — IRON,TIBC AND FERRITIN PANEL
%SAT: 37 % (calc) (ref 16–45)
Ferritin: 17 ng/mL (ref 16–154)
Iron: 147 ug/dL (ref 40–190)
TIBC: 393 mcg/dL (calc) (ref 250–450)

## 2020-01-09 LAB — HIV ANTIBODY (ROUTINE TESTING W REFLEX): HIV 1&2 Ab, 4th Generation: NONREACTIVE

## 2020-01-13 ENCOUNTER — Telehealth: Payer: Self-pay | Admitting: Nurse Practitioner

## 2020-01-13 LAB — CYTOLOGY - PAP
Adequacy: ABSENT
Comment: NEGATIVE
Diagnosis: UNDETERMINED — AB
High risk HPV: NEGATIVE

## 2020-01-13 MED ORDER — METRONIDAZOLE 0.75 % VA GEL: 1.0000 | Freq: Every day | VAGINAL | 0 refills | Status: AC

## 2020-01-13 NOTE — Telephone Encounter (Signed)
Patient would like call with recent lab results.

## 2020-01-13 NOTE — Telephone Encounter (Signed)
Called patient no answer unable to leave voicemail .

## 2020-01-14 NOTE — Telephone Encounter (Signed)
Pt was notified and verbally understood results. °
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
